# Patient Record
Sex: Female | Born: 1955 | Race: Black or African American | Hispanic: No | Marital: Single | Smoking: Never smoker
Health system: Southern US, Community
[De-identification: ages and names within clinical notes are randomized; demographics above are authoritative.]

## PROBLEM LIST (undated history)

## (undated) DIAGNOSIS — K219 Gastro-esophageal reflux disease without esophagitis: Secondary | ICD-10-CM

## (undated) DIAGNOSIS — M542 Cervicalgia: Secondary | ICD-10-CM

## (undated) DIAGNOSIS — I10 Essential (primary) hypertension: Secondary | ICD-10-CM

## (undated) HISTORY — PX: UPPER GI ENDOSCOPY: SHX6162

## (undated) HISTORY — PX: TUBAL LIGATION: SHX77

## (undated) HISTORY — PX: COLONOSCOPY: SHX174

## (undated) HISTORY — DX: Cervicalgia: M54.2

---

## 1997-11-10 ENCOUNTER — Other Ambulatory Visit: Admission: RE | Admit: 1997-11-10 | Discharge: 1997-11-10 | Payer: Self-pay | Admitting: Obstetrics & Gynecology

## 1998-06-02 ENCOUNTER — Inpatient Hospital Stay (HOSPITAL_COMMUNITY): Admission: AD | Admit: 1998-06-02 | Discharge: 1998-06-05 | Payer: Self-pay

## 2005-05-14 ENCOUNTER — Emergency Department (HOSPITAL_COMMUNITY): Admission: EM | Admit: 2005-05-14 | Discharge: 2005-05-14 | Payer: Self-pay | Admitting: Emergency Medicine

## 2005-08-09 ENCOUNTER — Emergency Department (HOSPITAL_COMMUNITY): Admission: EM | Admit: 2005-08-09 | Discharge: 2005-08-09 | Payer: Self-pay | Admitting: Family Medicine

## 2005-10-09 ENCOUNTER — Emergency Department (HOSPITAL_COMMUNITY): Admission: EM | Admit: 2005-10-09 | Discharge: 2005-10-09 | Payer: Self-pay | Admitting: Family Medicine

## 2006-05-22 ENCOUNTER — Emergency Department (HOSPITAL_COMMUNITY): Admission: EM | Admit: 2006-05-22 | Discharge: 2006-05-22 | Payer: Self-pay | Admitting: Family Medicine

## 2007-02-10 ENCOUNTER — Ambulatory Visit (HOSPITAL_COMMUNITY): Admission: RE | Admit: 2007-02-10 | Discharge: 2007-02-10 | Payer: Self-pay | Admitting: Obstetrics and Gynecology

## 2008-03-29 ENCOUNTER — Ambulatory Visit (HOSPITAL_COMMUNITY): Admission: RE | Admit: 2008-03-29 | Discharge: 2008-03-29 | Payer: Self-pay | Admitting: Obstetrics and Gynecology

## 2008-07-23 ENCOUNTER — Emergency Department (HOSPITAL_COMMUNITY): Admission: EM | Admit: 2008-07-23 | Discharge: 2008-07-23 | Payer: Self-pay | Admitting: Family Medicine

## 2009-05-19 ENCOUNTER — Emergency Department (HOSPITAL_COMMUNITY): Admission: EM | Admit: 2009-05-19 | Discharge: 2009-05-19 | Payer: Self-pay | Admitting: Emergency Medicine

## 2009-11-02 ENCOUNTER — Emergency Department (HOSPITAL_COMMUNITY): Admission: EM | Admit: 2009-11-02 | Discharge: 2009-11-02 | Payer: Self-pay | Admitting: Family Medicine

## 2010-07-16 ENCOUNTER — Encounter: Payer: Self-pay | Admitting: Obstetrics and Gynecology

## 2010-11-23 ENCOUNTER — Inpatient Hospital Stay (INDEPENDENT_AMBULATORY_CARE_PROVIDER_SITE_OTHER)
Admission: RE | Admit: 2010-11-23 | Discharge: 2010-11-23 | Disposition: A | Discharge status: Home / Self Care | Payer: Commercial Managed Care - PPO | Source: Ambulatory Visit | Attending: Family Medicine | Admitting: Family Medicine

## 2010-11-23 DIAGNOSIS — M542 Cervicalgia: Secondary | ICD-10-CM

## 2012-08-27 ENCOUNTER — Other Ambulatory Visit: Payer: Self-pay | Admitting: Orthopedic Surgery

## 2012-09-24 ENCOUNTER — Encounter (HOSPITAL_BASED_OUTPATIENT_CLINIC_OR_DEPARTMENT_OTHER)
Admission: RE | Admit: 2012-09-24 | Discharge: 2012-09-24 | Disposition: A | Discharge status: Home / Self Care | Payer: Commercial Managed Care - PPO | Source: Ambulatory Visit | Attending: Orthopedic Surgery | Admitting: Orthopedic Surgery

## 2012-09-24 ENCOUNTER — Encounter (HOSPITAL_BASED_OUTPATIENT_CLINIC_OR_DEPARTMENT_OTHER): Payer: Self-pay | Admitting: *Deleted

## 2012-09-24 LAB — BASIC METABOLIC PANEL
BUN: 8 mg/dL (ref 6–23)
Calcium: 9 mg/dL (ref 8.4–10.5)
Chloride: 104 mEq/L (ref 96–112)
Creatinine, Ser: 0.84 mg/dL (ref 0.50–1.10)
GFR calc Af Amer: 88 mL/min — ABNORMAL LOW (ref >90)

## 2012-09-24 NOTE — Progress Notes (Signed)
To come in for bet-ekg-had ekg over yr ago [address]

## 2012-09-29 ENCOUNTER — Ambulatory Visit (HOSPITAL_BASED_OUTPATIENT_CLINIC_OR_DEPARTMENT_OTHER)
Admission: RE | Admit: 2012-09-29 | Discharge: 2012-09-29 | Disposition: A | Discharge status: Home / Self Care | Payer: Commercial Managed Care - PPO | Source: Ambulatory Visit | Attending: Orthopedic Surgery | Admitting: Orthopedic Surgery

## 2012-09-29 ENCOUNTER — Ambulatory Visit (HOSPITAL_BASED_OUTPATIENT_CLINIC_OR_DEPARTMENT_OTHER): Payer: Commercial Managed Care - PPO | Admitting: Anesthesiology

## 2012-09-29 ENCOUNTER — Encounter (HOSPITAL_BASED_OUTPATIENT_CLINIC_OR_DEPARTMENT_OTHER): Payer: Self-pay | Admitting: Anesthesiology

## 2012-09-29 ENCOUNTER — Encounter (HOSPITAL_BASED_OUTPATIENT_CLINIC_OR_DEPARTMENT_OTHER)
Admission: RE | Disposition: A | Discharge status: Home / Self Care | Payer: Self-pay | Source: Ambulatory Visit | Attending: Orthopedic Surgery

## 2012-09-29 ENCOUNTER — Encounter (HOSPITAL_BASED_OUTPATIENT_CLINIC_OR_DEPARTMENT_OTHER): Payer: Self-pay

## 2012-09-29 DIAGNOSIS — D649 Anemia, unspecified: Secondary | ICD-10-CM | POA: Insufficient documentation

## 2012-09-29 DIAGNOSIS — K219 Gastro-esophageal reflux disease without esophagitis: Secondary | ICD-10-CM | POA: Insufficient documentation

## 2012-09-29 DIAGNOSIS — I10 Essential (primary) hypertension: Secondary | ICD-10-CM | POA: Insufficient documentation

## 2012-09-29 DIAGNOSIS — Z79899 Other long term (current) drug therapy: Secondary | ICD-10-CM | POA: Insufficient documentation

## 2012-09-29 DIAGNOSIS — M6530 Trigger finger, unspecified finger: Secondary | ICD-10-CM | POA: Insufficient documentation

## 2012-09-29 HISTORY — DX: Essential (primary) hypertension: I10

## 2012-09-29 HISTORY — DX: Gastro-esophageal reflux disease without esophagitis: K21.9

## 2012-09-29 HISTORY — PX: TRIGGER FINGER RELEASE: SHX641

## 2012-09-29 SURGERY — RELEASE, A1 PULLEY, FOR TRIGGER FINGER
Anesthesia: General | Site: Finger | Laterality: Right | Wound class: Clean

## 2012-09-29 MED ORDER — FENTANYL CITRATE 0.05 MG/ML IJ SOLN
INTRAMUSCULAR | Status: DC | PRN
  Administered 2012-09-29: 100 ug via INTRAVENOUS

## 2012-09-29 MED ORDER — CEFAZOLIN SODIUM-DEXTROSE 2-3 GM-% IV SOLR: 2.0000 g | INTRAVENOUS | Status: DC

## 2012-09-29 MED ORDER — MIDAZOLAM HCL 5 MG/5ML IJ SOLN
INTRAMUSCULAR | Status: DC | PRN
  Administered 2012-09-29: 2 mg via INTRAVENOUS

## 2012-09-29 MED ORDER — PROPOFOL 10 MG/ML IV BOLUS
INTRAVENOUS | Status: DC | PRN
  Administered 2012-09-29: 150 mg via INTRAVENOUS

## 2012-09-29 MED ORDER — OXYCODONE HCL 5 MG PO TABS: 5.0000 mg | ORAL_TABLET | Freq: Once | ORAL | Status: DC | PRN

## 2012-09-29 MED ORDER — LIDOCAINE HCL (CARDIAC) 20 MG/ML IV SOLN
INTRAVENOUS | Status: DC | PRN
  Administered 2012-09-29: 60 mg via INTRAVENOUS

## 2012-09-29 MED ORDER — CHLORHEXIDINE GLUCONATE 4 % EX LIQD: 60.0000 mL | Freq: Once | CUTANEOUS | Status: DC

## 2012-09-29 MED ORDER — ONDANSETRON HCL 4 MG/2ML IJ SOLN
INTRAMUSCULAR | Status: DC | PRN
  Administered 2012-09-29: 4 mg via INTRAVENOUS

## 2012-09-29 MED ORDER — HYDROMORPHONE HCL PF 1 MG/ML IJ SOLN: 0.2500 mg | INTRAMUSCULAR | Status: DC | PRN

## 2012-09-29 MED ORDER — DEXAMETHASONE SODIUM PHOSPHATE 10 MG/ML IJ SOLN
INTRAMUSCULAR | Status: DC | PRN
  Administered 2012-09-29: 10 mg via INTRAVENOUS

## 2012-09-29 MED ORDER — FENTANYL CITRATE 0.05 MG/ML IJ SOLN: 50.0000 ug | INTRAMUSCULAR | Status: DC | PRN

## 2012-09-29 MED ORDER — CEFAZOLIN SODIUM-DEXTROSE 2-3 GM-% IV SOLR
2.0000 g | INTRAVENOUS | Status: AC
  Administered 2012-09-29: 2 g via INTRAVENOUS

## 2012-09-29 MED ORDER — LACTATED RINGERS IV SOLN
INTRAVENOUS | Status: DC
  Administered 2012-09-29: 13:00:00 via INTRAVENOUS

## 2012-09-29 MED ORDER — BUPIVACAINE HCL (PF) 0.25 % IJ SOLN
INTRAMUSCULAR | Status: DC | PRN
  Administered 2012-09-29: 5 mL

## 2012-09-29 MED ORDER — HYDROCODONE-ACETAMINOPHEN 5-325 MG PO TABS: 1.0000 | ORAL_TABLET | Freq: Four times a day (QID) | ORAL | Status: DC | PRN

## 2012-09-29 MED ORDER — MIDAZOLAM HCL 2 MG/2ML IJ SOLN: 1.0000 mg | INTRAMUSCULAR | Status: DC | PRN

## 2012-09-29 MED ORDER — OXYCODONE HCL 5 MG/5ML PO SOLN: 5.0000 mg | Freq: Once | ORAL | Status: DC | PRN

## 2012-09-29 SURGICAL SUPPLY — 34 items
BANDAGE COBAN STERILE 2 (GAUZE/BANDAGES/DRESSINGS) ×2 IMPLANT
BLADE SURG 15 STRL LF DISP TIS (BLADE) ×1 IMPLANT
BLADE SURG 15 STRL SS (BLADE) ×1
BNDG ESMARK 4X9 LF (GAUZE/BANDAGES/DRESSINGS) ×2 IMPLANT
CHLORAPREP W/TINT 26ML (MISCELLANEOUS) ×2 IMPLANT
CLOTH BEACON ORANGE TIMEOUT ST (SAFETY) ×2 IMPLANT
CORDS BIPOLAR (ELECTRODE) IMPLANT
COVER MAYO STAND STRL (DRAPES) ×2 IMPLANT
COVER TABLE BACK 60X90 (DRAPES) ×2 IMPLANT
CUFF TOURNIQUET SINGLE 18IN (TOURNIQUET CUFF) ×2 IMPLANT
DECANTER SPIKE VIAL GLASS SM (MISCELLANEOUS) IMPLANT
DRAPE EXTREMITY T 121X128X90 (DRAPE) ×2 IMPLANT
DRAPE SURG 17X23 STRL (DRAPES) ×2 IMPLANT
GAUZE XEROFORM 1X8 LF (GAUZE/BANDAGES/DRESSINGS) ×2 IMPLANT
GLOVE BIO SURGEON STRL SZ 6.5 (GLOVE) ×2 IMPLANT
GLOVE BIOGEL PI IND STRL 8.5 (GLOVE) ×1 IMPLANT
GLOVE BIOGEL PI INDICATOR 8.5 (GLOVE) ×1
GLOVE SKINSENSE NS SZ7.0 (GLOVE) ×1
GLOVE SKINSENSE STRL SZ7.0 (GLOVE) ×1 IMPLANT
GLOVE SURG ORTHO 8.0 STRL STRW (GLOVE) ×2 IMPLANT
GOWN BRE IMP PREV XXLGXLNG (GOWN DISPOSABLE) ×2 IMPLANT
GOWN PREVENTION PLUS XLARGE (GOWN DISPOSABLE) ×2 IMPLANT
NEEDLE 27GAX1X1/2 (NEEDLE) ×2 IMPLANT
NS IRRIG 1000ML POUR BTL (IV SOLUTION) ×2 IMPLANT
PACK BASIN DAY SURGERY FS (CUSTOM PROCEDURE TRAY) ×2 IMPLANT
PADDING CAST ABS 4INX4YD NS (CAST SUPPLIES) ×1
PADDING CAST ABS COTTON 4X4 ST (CAST SUPPLIES) ×1 IMPLANT
SPONGE GAUZE 4X4 12PLY (GAUZE/BANDAGES/DRESSINGS) ×2 IMPLANT
STOCKINETTE 4X48 STRL (DRAPES) ×2 IMPLANT
SUT VICRYL RAPIDE 4/0 PS 2 (SUTURE) ×2 IMPLANT
SYR BULB 3OZ (MISCELLANEOUS) ×2 IMPLANT
SYR CONTROL 10ML LL (SYRINGE) ×2 IMPLANT
TOWEL OR 17X24 6PK STRL BLUE (TOWEL DISPOSABLE) ×2 IMPLANT
UNDERPAD 30X30 INCONTINENT (UNDERPADS AND DIAPERS) ×2 IMPLANT

## 2012-09-29 NOTE — Anesthesia Procedure Notes (Signed)
Procedure Name: LMA Insertion Date/Time: 09/29/2012 1:46 PM Performed by: Meyer Russel Pre-anesthesia Checklist: Patient identified, Emergency Drugs available, Suction available and Patient being monitored Patient Re-evaluated:Patient Re-evaluated prior to inductionOxygen Delivery Method: Circle System Utilized Preoxygenation: Pre-oxygenation with 100% oxygen Intubation Type: IV induction Ventilation: Mask ventilation without difficulty LMA: LMA inserted LMA Size: 4.0 Number of attempts: 1 Airway Equipment and Method: bite block Placement Confirmation: positive ETCO2 and breath sounds checked- equal and bilateral Tube secured with: Tape Dental Injury: Teeth and Oropharynx as per pre-operative assessment

## 2012-09-29 NOTE — Anesthesia Postprocedure Evaluation (Signed)
  Anesthesia Post-op Note  Patient: Claire Torres  Procedure(s) Performed: Procedure(s): RELEASE A-1 PULLEY RIGHT MIDDLE FINGER  (Right)  Patient Location: PACU  Anesthesia Type:General  Level of Consciousness: awake and alert   Airway and Oxygen Therapy: Patient Spontanous Breathing  Post-op Pain: none  Post-op Assessment: Post-op Vital signs reviewed, Patient's Cardiovascular Status Stable, Respiratory Function Stable, Patent Airway and No signs of Nausea or vomiting  Post-op Vital Signs: Reviewed and stable  Complications: No apparent anesthesia complications

## 2012-09-29 NOTE — Op Note (Signed)
Dictation Number [id number]

## 2012-09-29 NOTE — Transfer of Care (Signed)
Immediate Anesthesia Transfer of Care Note  Patient: Claire Torres  Procedure(s) Performed: Procedure(s): RELEASE A-1 PULLEY RIGHT MIDDLE FINGER  (Right)  Patient Location: PACU  Anesthesia Type:General  Level of Consciousness: awake and sedated  Airway & Oxygen Therapy: Patient Spontanous Breathing and Patient connected to face mask oxygen  Post-op Assessment: Report given to PACU RN, Post -op Vital signs reviewed and stable and Patient moving all extremities  Post vital signs: Reviewed and stable  Complications: No apparent anesthesia complications

## 2012-09-29 NOTE — Brief Op Note (Signed)
09/29/2012  2:07 PM  PATIENT:  Claire Torres  57 y.o. female  PRE-OPERATIVE DIAGNOSIS:  STENOSING TENOSYNOVITIS RIGHT MIDDLE FINGER   POST-OPERATIVE DIAGNOSIS:  STENOSING TENOSYNOVITIS RIGHT MIDDLE FINGER   PROCEDURE:  Procedure(s): RELEASE A-1 PULLEY RIGHT MIDDLE FINGER  (Right)  SURGEON:  Surgeon(s) and Role:    * Nicki Reaper, MD - Primary  PHYSICIAN ASSISTANT:   ASSISTANTS: none   ANESTHESIA:   local and general  EBL:  Total I/O In: 800 [I.V.:800] Out: -   BLOOD ADMINISTERED:none  DRAINS: none   LOCAL MEDICATIONS USED:  MARCAINE     SPECIMEN:  No Specimen  DISPOSITION OF SPECIMEN:  N/A  COUNTS:  YES  TOURNIQUET:   Total Tourniquet Time Documented: Forearm (Right) - 9 minutes Total: Forearm (Right) - 9 minutes   DICTATION: .Other Dictation: Dictation Number [id number]  PLAN OF CARE: Discharge to home after PACU  PATIENT DISPOSITION:  PACU - hemodynamically stable.

## 2012-09-29 NOTE — Anesthesia Preprocedure Evaluation (Signed)
Anesthesia Evaluation  Patient identified by MRN, date of birth, ID band Patient awake    Reviewed: Allergy & Precautions, H&P , NPO status , Patient's Chart, lab work & pertinent test results  Airway Mallampati: II TM Distance: >3 FB Neck ROM: Full    Dental no notable dental hx. (+) Teeth Intact and Dental Advisory Given   Pulmonary neg pulmonary ROS,  breath sounds clear to auscultation  Pulmonary exam normal       Cardiovascular hypertension, On Medications Rhythm:Regular Rate:Normal     Neuro/Psych negative neurological ROS  negative psych ROS   GI/Hepatic Neg liver ROS, GERD-  Controlled,  Endo/Other  negative endocrine ROS  Renal/GU negative Renal ROS  negative genitourinary   Musculoskeletal   Abdominal   Peds  Hematology negative hematology ROS (+)   Anesthesia Other Findings   Reproductive/Obstetrics negative OB ROS                           Anesthesia Physical Anesthesia Plan  ASA: II  Anesthesia Plan: General   Post-op Pain Management:    Induction: Intravenous  Airway Management Planned: LMA  Additional Equipment:   Intra-op Plan:   Post-operative Plan: Extubation in OR  Informed Consent: I have reviewed the patients History and Physical, chart, labs and discussed the procedure including the risks, benefits and alternatives for the proposed anesthesia with the patient or authorized representative who has indicated his/her understanding and acceptance.   Dental advisory given  Plan Discussed with: CRNA  Anesthesia Plan Comments:         Anesthesia Quick Evaluation

## 2012-09-29 NOTE — H&P (Signed)
  Claire Torres is a 57 year-old left-hand dominant female who comes in complaining of catching of right middle finger. This has been going on for approximately three months. She has no history of injury. There is a family history of diabetes, no history of thyroid problems, arthritis or gout.  She has been tested for diabetes, this has been negative.  She complains of an intermittent, moderate pain when it catches. This will occasionally awaken her from sleep.  She is not complaining of any numbness or tingling.  This has done well, but has been injected on two occasions without relief of symptoms.    ALLERGIES:    None.  MEDICATIONS:    None.  SURGICAL HISTORY:   Tubal ligation.     FAMILY MEDICAL HISTORY:    Positive for diabetes, heart disease and high blood pressure.    SOCIAL HISTORY:    She does not smoke or drink. She is single. She is a Chief Financial Officer.    REVIEW OF SYSTEMS:    Positive for high blood pressure, anemia, otherwise negative 14 points.   Claire Torres is an 57 y.o. female.   Chief Complaint: sts rmf HPI: see above  Past Medical History  Diagnosis Date  . Hypertension   . GERD (gastroesophageal reflux disease)     hx no meds    Past Surgical History  Procedure Laterality Date  . Tubal ligation    . Colonoscopy    . Upper gi endoscopy      History reviewed. No pertinent family history. Social History:  reports that she has never smoked. She does not have any smokeless tobacco history on file. She reports that she does not drink alcohol or use illicit drugs.  Allergies: No Known Allergies  Medications Prior to Admission  Medication Sig Dispense Refill  . losartan (COZAAR) 50 MG tablet Take 50 mg by mouth every evening.      . Vitamin D, Ergocalciferol, (DRISDOL) 50000 UNITS CAPS Take 50,000 Units by mouth every 7 (seven) days.        No results found for this or any previous visit (from the past 48 hour(s)).  No results found.   Pertinent items are  noted in HPI.  Blood pressure 148/91, pulse 65, temperature 97.4 F (36.3 C), temperature source Oral, resp. rate 20, height 5\' 3"  (1.6 m), weight 71.578 kg (157 lb 12.8 oz), SpO2 99.00%.  General appearance: alert, cooperative and appears stated age Head: Normocephalic, without obvious abnormality Neck: no JVD Resp: clear to auscultation bilaterally Cardio: regular rate and rhythm, S1, S2 normal, no murmur, click, rub or gallop GI: soft, non-tender; bowel sounds normal; no masses,  no organomegaly Extremities: extremities normal, atraumatic, no cyanosis or edema Pulses: 2+ and symmetric Skin: Skin color, texture, turgor normal. No rashes or lesions Neurologic: Grossly normal Incision/Wound: na  Assessment/Plan  DIAGNOSIS:     Stenosing tenosynovitis right middle finger.  Plan:  The pre, peri and postoperative course were discussed along with the risks and complications.  The patient is aware there is no guarantee with the surgery, possibility of infection, recurrence, injury to arteries, nerves, tendons, incomplete relief of symptoms and dystrophy.  She is scheduled for release A-1 pulley right middle finger as an outpatient under regional anesthesia.  [name] R 09/29/2012, 1:35 PM

## 2012-09-30 ENCOUNTER — Encounter (HOSPITAL_BASED_OUTPATIENT_CLINIC_OR_DEPARTMENT_OTHER): Payer: Self-pay | Admitting: Orthopedic Surgery

## 2012-09-30 NOTE — Op Note (Signed)
NAMECLARABELL, MATSUOKA                 ACCOUNT NO.:  [id number]  MEDICAL RECORD NO.:  [id number]  LOCATION:                                 FACILITY:  PHYSICIAN:  Cindee Salt, M.D.            DATE OF BIRTH:  DATE OF PROCEDURE:  09/29/2012 DATE OF DISCHARGE:                              OPERATIVE REPORT   PREOPERATIVE DIAGNOSIS:  Stenosing tenosynovitis, right middle finger.  POSTOPERATIVE DIAGNOSIS:  Stenosing tenosynovitis, right middle finger.  OPERATION:  Release A1 pulley, right middle finger.  SURGEON:  Cindee Salt, MD  ANESTHESIA:  General with local infiltration.  ANESTHESIOLOGIST:  Zenon Mayo, MD  HISTORY:  The patient is a 57 year old female with triggering of her right middle finger.  This has not responded to injections.  She has elected to undergo surgical release.  Pre, peri, and postoperative course have been discussed along with risks and complications.  She is aware that there is no guarantee with surgery; possibility of infection, recurrence of injury to arteries, nerves, tendons, incomplete relief of symptoms, dystrophy.  In the preoperative area, the patient is seen, the extremity marked by both patient and surgeon.  Antibiotic given.  PROCEDURE:  The patient was brought to the operating room where a general anesthetic was carried out without difficulty.  She was prepped using ChloraPrep, supine position, right arm free.  A 3-minute dry time was allowed.  Time-out taken, confirming patient and procedure.  An oblique incision was made over the A1 pulley of the right middle finger and carried down through subcutaneous tissue.  Bleeders were electrocauterized with bipolar.  The dissection carried down to the A1 pulley.  This was released on its radial aspect after placement of retractors protecting neurovascular bundles.  A small incision was made centrally in A2.  Finger placed through a full range motion.  No further triggering was noted.  Partial  tenosynovectomy was performed proximally relieving any adherence between superficialis profundus tenosynovium. Wound was copiously irrigated with saline.  The skin was closed with interrupted 4-0 Vicryl Rapide sutures.  Local infiltration with 0.25% Marcaine without epinephrine was given, 5 mL was used.  Sterile compressive dressing was applied with the fingers free.  On deflation of the tourniquet, all fingers immediately pinked.  She was taken to the recovery room for observation in satisfactory condition.  She will be discharged home to return to the [facility] in 1 week on Norco.          ______________________________ Cindee Salt, M.D.     GK/MEDQ  D:  09/29/2012  T:  09/30/2012  Job:  [id number]

## 2013-05-02 ENCOUNTER — Emergency Department (HOSPITAL_COMMUNITY)
Admission: EM | Admit: 2013-05-02 | Discharge: 2013-05-02 | Disposition: A | Discharge status: Home / Self Care | Payer: Commercial Managed Care - PPO | Attending: Emergency Medicine | Admitting: Emergency Medicine

## 2013-05-02 ENCOUNTER — Encounter (HOSPITAL_COMMUNITY): Payer: Self-pay | Admitting: Emergency Medicine

## 2013-05-02 DIAGNOSIS — I10 Essential (primary) hypertension: Secondary | ICD-10-CM | POA: Insufficient documentation

## 2013-05-02 DIAGNOSIS — J069 Acute upper respiratory infection, unspecified: Secondary | ICD-10-CM | POA: Insufficient documentation

## 2013-05-02 DIAGNOSIS — G8929 Other chronic pain: Secondary | ICD-10-CM | POA: Insufficient documentation

## 2013-05-02 DIAGNOSIS — S139XXA Sprain of joints and ligaments of unspecified parts of neck, initial encounter: Secondary | ICD-10-CM | POA: Insufficient documentation

## 2013-05-02 DIAGNOSIS — Y929 Unspecified place or not applicable: Secondary | ICD-10-CM | POA: Insufficient documentation

## 2013-05-02 DIAGNOSIS — S161XXA Strain of muscle, fascia and tendon at neck level, initial encounter: Secondary | ICD-10-CM

## 2013-05-02 DIAGNOSIS — X58XXXA Exposure to other specified factors, initial encounter: Secondary | ICD-10-CM | POA: Insufficient documentation

## 2013-05-02 DIAGNOSIS — Z8719 Personal history of other diseases of the digestive system: Secondary | ICD-10-CM | POA: Insufficient documentation

## 2013-05-02 DIAGNOSIS — Y9389 Activity, other specified: Secondary | ICD-10-CM | POA: Insufficient documentation

## 2013-05-02 MED ORDER — PREDNISONE 50 MG PO TABS: 50.0000 mg | ORAL_TABLET | Freq: Every day | ORAL | Status: DC

## 2013-05-02 MED ORDER — ACETAMINOPHEN-CODEINE 120-12 MG/5ML PO SOLN: 10.0000 mL | ORAL | Status: DC | PRN

## 2013-05-02 MED ORDER — GUAIFENESIN ER 1200 MG PO TB12: 1.0000 | ORAL_TABLET | Freq: Two times a day (BID) | ORAL | Status: DC

## 2013-05-02 NOTE — ED Notes (Addendum)
Patient states that she has cold like symptoms, nasal congestion, sore throat, ears stopped up. Also complains of neck pain due to work requirements. Patient states she is constantly looking upward. Pain is a 9/10 is aggravated when lifting and pulling.

## 2013-05-02 NOTE — ED Provider Notes (Signed)
CSN: [id number]     Arrival date & time 05/02/13  1333 History  This chart was scribed for non-physician practitioner Ebbie Ridge, PA, working with Lyanne Co, MD by Ronal Fear, ED scribe. This patient was seen in room WTR8/WTR8 and the patient's care was started at 1:57 PM.    Chief Complaint  Patient presents with  . Neck Pain  . Nasal Congestion   The history is provided by the patient. No language interpreter was used.   HPI Comments: Claire Torres is a 57 y.o. female who presents to the Emergency Department complaining of waxing and waning chronic neck pain with cold symptoms that started last night, sore throat and coughing.  She has coworkers that are also exhibiting similar symptoms. Pt has taken tylenol and coricidin with mild relief. She states that she has been congested since last night. Pt denies vomiting. She does not appear to be in any acute distress with no other complaints.   Past Medical History  Diagnosis Date  . Hypertension   . GERD (gastroesophageal reflux disease)     hx no meds   Past Surgical History  Procedure Laterality Date  . Tubal ligation    . Colonoscopy    . Upper gi endoscopy    . Trigger finger release Right 09/29/2012    Procedure: RELEASE A-1 PULLEY RIGHT MIDDLE FINGER ;  Surgeon: Nicki Reaper, MD;  Location: [facility];  Service: Orthopedics;  Laterality: Right;   No family history on file. History  Substance Use Topics  . Smoking status: Never Smoker   . Smokeless tobacco: Not on file  . Alcohol Use: No   OB History   Grav Para Term Preterm Abortions TAB SAB Ect Mult Living                 Review of Systems  Constitutional: Negative for chills.  HENT: Positive for congestion and sore throat.   Respiratory: Positive for cough.   Gastrointestinal: Negative for nausea, vomiting and abdominal pain.  Musculoskeletal: Positive for neck pain.  All other systems reviewed and are negative.    Allergies  Review of  patient's allergies indicates no known allergies.  Home Medications   Current Outpatient Rx  Name  Route  Sig  Dispense  Refill  . HYDROcodone-acetaminophen (NORCO) 5-325 MG per tablet   Oral   Take 1 tablet by mouth every 6 (six) hours as needed for pain.   30 tablet   0   . losartan (COZAAR) 50 MG tablet   Oral   Take 50 mg by mouth every evening.         . Vitamin D, Ergocalciferol, (DRISDOL) 50000 UNITS CAPS   Oral   Take 50,000 Units by mouth every 7 (seven) days.          BP 123/82  Pulse 106  Temp(Src) 98.3 F (36.8 C)  Resp 20  SpO2 98% Physical Exam  Nursing note and vitals reviewed. Constitutional: She is oriented to person, place, and time. She appears well-developed and well-nourished.  HENT:  Head: Normocephalic and atraumatic.  Nose: Nose normal.  Mouth/Throat: Oropharynx is clear and moist.  Eyes: Pupils are equal, round, and reactive to light.  Neck: Normal range of motion. Neck supple.  Cardiovascular: Normal rate, regular rhythm and normal heart sounds.   Pulmonary/Chest: Effort normal and breath sounds normal. No respiratory distress. She has no wheezes.  Neurological: She is alert and oriented to person, place, and time.  Skin: Skin is warm and dry.  Psychiatric: She has a normal mood and affect.    ED Course  Procedures (including critical care time) DIAGNOSTIC STUDIES: Oxygen Saturation is 98% on RA, normal by my interpretation.    COORDINATION OF CARE:    2:00 PM- Pt advised of plan for treatment including medication for pain and congestion and pt agrees.    Carlyle Dolly, PA-C 05/03/13 1404

## 2013-05-03 NOTE — ED Provider Notes (Signed)
Medical screening examination/treatment/procedure(s) were performed by non-physician practitioner and as supervising physician I was immediately available for consultation/collaboration.  EKG Interpretation   None         Syble Picco M Bemnet Trovato, MD 05/03/13 1405 

## 2013-05-04 ENCOUNTER — Emergency Department (HOSPITAL_COMMUNITY): Payer: Commercial Managed Care - PPO

## 2013-05-04 ENCOUNTER — Encounter (HOSPITAL_COMMUNITY): Payer: Self-pay | Admitting: Emergency Medicine

## 2013-05-04 ENCOUNTER — Emergency Department (HOSPITAL_COMMUNITY)
Admission: EM | Admit: 2013-05-04 | Discharge: 2013-05-04 | Disposition: A | Discharge status: Home / Self Care | Payer: Commercial Managed Care - PPO | Attending: Emergency Medicine | Admitting: Emergency Medicine

## 2013-05-04 DIAGNOSIS — B9789 Other viral agents as the cause of diseases classified elsewhere: Secondary | ICD-10-CM | POA: Insufficient documentation

## 2013-05-04 DIAGNOSIS — M542 Cervicalgia: Secondary | ICD-10-CM | POA: Insufficient documentation

## 2013-05-04 DIAGNOSIS — Z8719 Personal history of other diseases of the digestive system: Secondary | ICD-10-CM | POA: Insufficient documentation

## 2013-05-04 DIAGNOSIS — R5381 Other malaise: Secondary | ICD-10-CM | POA: Insufficient documentation

## 2013-05-04 DIAGNOSIS — M25569 Pain in unspecified knee: Secondary | ICD-10-CM | POA: Insufficient documentation

## 2013-05-04 DIAGNOSIS — B349 Viral infection, unspecified: Secondary | ICD-10-CM

## 2013-05-04 DIAGNOSIS — M549 Dorsalgia, unspecified: Secondary | ICD-10-CM | POA: Insufficient documentation

## 2013-05-04 DIAGNOSIS — J029 Acute pharyngitis, unspecified: Secondary | ICD-10-CM | POA: Insufficient documentation

## 2013-05-04 DIAGNOSIS — M25559 Pain in unspecified hip: Secondary | ICD-10-CM | POA: Insufficient documentation

## 2013-05-04 DIAGNOSIS — R000 Tachycardia, unspecified: Secondary | ICD-10-CM | POA: Insufficient documentation

## 2013-05-04 DIAGNOSIS — I10 Essential (primary) hypertension: Secondary | ICD-10-CM | POA: Insufficient documentation

## 2013-05-04 DIAGNOSIS — IMO0002 Reserved for concepts with insufficient information to code with codable children: Secondary | ICD-10-CM | POA: Insufficient documentation

## 2013-05-04 DIAGNOSIS — Z79899 Other long term (current) drug therapy: Secondary | ICD-10-CM | POA: Insufficient documentation

## 2013-05-04 LAB — BASIC METABOLIC PANEL
CO2: 26 mEq/L (ref 19–32)
Calcium: 9.1 mg/dL (ref 8.4–10.5)
Creatinine, Ser: 0.81 mg/dL (ref 0.50–1.10)
GFR calc non Af Amer: 79 mL/min — ABNORMAL LOW (ref >90)
Sodium: 135 mEq/L (ref 135–145)

## 2013-05-04 LAB — CBC WITH DIFFERENTIAL/PLATELET
Basophils Absolute: 0 10*3/uL (ref 0.0–0.1)
Basophils Relative: 0 % (ref 0–1)
Eosinophils Absolute: 0 10*3/uL (ref 0.0–0.7)
Eosinophils Relative: 0 % (ref 0–5)
HCT: 36.8 % (ref 36.0–46.0)
Lymphocytes Relative: 5 % — ABNORMAL LOW (ref 12–46)
MCH: 30.4 pg (ref 26.0–34.0)
MCHC: 33.4 g/dL (ref 30.0–36.0)
MCV: 91.1 fL (ref 78.0–100.0)
Monocytes Absolute: 0.9 10*3/uL (ref 0.1–1.0)
Platelets: 236 10*3/uL (ref 150–400)
RDW: 12.8 % (ref 11.5–15.5)
WBC: 13.6 10*3/uL — ABNORMAL HIGH (ref 4.0–10.5)

## 2013-05-04 MED ORDER — SODIUM CHLORIDE 0.9 % IV BOLUS (SEPSIS)
1000.0000 mL | Freq: Once | INTRAVENOUS | Status: AC
  Administered 2013-05-04: 1000 mL via INTRAVENOUS

## 2013-05-04 MED ORDER — KETOROLAC TROMETHAMINE 30 MG/ML IJ SOLN
30.0000 mg | Freq: Once | INTRAMUSCULAR | Status: AC
  Administered 2013-05-04: 30 mg via INTRAVENOUS
  Filled 2013-05-04: qty 1

## 2013-05-04 NOTE — ED Notes (Signed)
Patient is alert and oriented x3.   She is complaining of neck pain, leg pain and congestion.   She was seen at [facility] on Saturday and has not gotten any better.

## 2013-05-04 NOTE — ED Notes (Signed)
Patient back from CXR Patient appears in NAD Side rails up, call bell in reach

## 2013-05-04 NOTE — ED Provider Notes (Signed)
Medical screening examination/treatment/procedure(s) were performed by non-physician practitioner and as supervising physician I was immediately available for consultation/collaboration.    Sunnie Nielsen, MD 05/04/13 [phone]

## 2013-05-04 NOTE — ED Provider Notes (Signed)
CSN: [id number]     Arrival date & time 05/04/13  0344 History   First MD Initiated Contact with Patient 05/04/13 0431     Chief Complaint  Patient presents with  . Body aches    HPI  History provided by the patient, family and recent medical chart. Patient is a 57 year old female with history of hypertension and GERD who presents with continued symptoms of fever, chills, bodyaches, cough, congestion and sore throat. Symptoms began 3 days ago. She was seen in the emergency department 2 days ago with similar complaints and diagnosed with URI. She was given prescriptions for Mucinex and liquid Tylenol with codeine for cough and pain. Patient used one dose of the Tylenol and codeine last night around midnight and reports this helped for about one hour. She also took Mucinex yesterday during the day but has not had any significant improvement of her congestion or cough symptoms. She returns with complaints of continued body aches including her lower legs, thighs, back and neck. She also reports mild headache symptoms. She denies any aggravating or alleviating factors. Patient is a Production designer, theatre/television/film at [employer] and reports many of her employees have been sick with cough and cold symptoms. No other associated symptoms.  No N/V/D.    Past Medical History  Diagnosis Date  . Hypertension   . GERD (gastroesophageal reflux disease)     hx no meds   Past Surgical History  Procedure Laterality Date  . Tubal ligation    . Colonoscopy    . Upper gi endoscopy    . Trigger finger release Right 09/29/2012    Procedure: RELEASE A-1 PULLEY RIGHT MIDDLE FINGER ;  Surgeon: Nicki Reaper, MD;  Location: [facility];  Service: Orthopedics;  Laterality: Right;   History reviewed. No pertinent family history. History  Substance Use Topics  . Smoking status: Never Smoker   . Smokeless tobacco: Not on file  . Alcohol Use: No   OB History   Grav Para Term Preterm Abortions TAB SAB Ect Mult Living           Review of Systems  Constitutional: Positive for fever, chills and fatigue.  HENT: Positive for congestion and sore throat.   Respiratory: Positive for cough. Negative for shortness of breath.   Gastrointestinal: Negative for nausea, vomiting, abdominal pain, diarrhea and constipation.  Musculoskeletal: Positive for back pain, myalgias and neck pain.  Neurological: Positive for headaches.  All other systems reviewed and are negative.    Allergies  Review of patient's allergies indicates no known allergies.  Home Medications   Current Outpatient Rx  Name  Route  Sig  Dispense  Refill  . acetaminophen-codeine 120-12 MG/5ML solution   Oral   Take 10 mLs by mouth every 4 (four) hours as needed for moderate pain.   120 mL   0   . guaiFENesin (MUCINEX) 600 MG 12 hr tablet   Oral   Take 1,200 mg by mouth 2 (two) times daily as needed for cough.         . losartan (COZAAR) 50 MG tablet   Oral   Take 50 mg by mouth every evening.         Marland Kitchen losartan-hydrochlorothiazide (HYZAAR) 100-25 MG per tablet   Oral   Take 1 tablet by mouth daily.         . Menthol, Topical Analgesic, (BLUE-EMU MAXIMUM STRENGTH EX)   Apply externally   Apply 1 application topically 3 (three) times daily as needed (  back pain).         . Vitamin D, Ergocalciferol, (DRISDOL) 50000 UNITS CAPS   Oral   Take 50,000 Units by mouth every 7 (seven) days.         . predniSONE (DELTASONE) 50 MG tablet   Oral   Take 1 tablet (50 mg total) by mouth daily.   5 tablet   0    BP 121/74  Pulse 112  Temp(Src) 100.4 F (38 C) (Oral)  Resp 20  SpO2 96% Physical Exam  Nursing note and vitals reviewed. Constitutional: She is oriented to person, place, and time. She appears well-developed and well-nourished. No distress.  HENT:  Head: Normocephalic.  Right Ear: Tympanic membrane normal.  Left Ear: Tympanic membrane normal.  Mouth/Throat: Oropharynx is clear and moist.  Eyes: Conjunctivae and  EOM are normal.  Neck: Normal range of motion. Neck supple.  No meningeal signs  Cardiovascular: Regular rhythm.  Tachycardia present.   No murmur heard. Pulmonary/Chest: Effort normal and breath sounds normal. No respiratory distress. She has no wheezes. She has no rales.  Abdominal: Soft. There is no tenderness. There is no rebound and no guarding.  Musculoskeletal: Normal range of motion. She exhibits no edema.  Lymphadenopathy:    She has no cervical adenopathy.  Neurological: She is alert and oriented to person, place, and time.  Skin: Skin is warm and dry. No rash noted.  Psychiatric: She has a normal mood and affect. Her behavior is normal.    ED Course  Procedures   DIAGNOSTIC STUDIES: Oxygen Saturation is 96% on room air.    COORDINATION OF CARE:  Nursing notes reviewed. Vital signs reviewed. Initial pt interview and examination performed.   4:43 AM-patient seen and evaluated. Patient does not appear in any acute distress. She does not appear severely ill or toxic. No meningeal signs. Does have fever and slight tachycardia. Discussed work up plan with pt at bedside, which includes lab testing, strep test.  Pt agrees with plan.   Treatment plan initiated: Medications  sodium chloride 0.9 % bolus 1,000 mL (not administered)  ketorolac (TORADOL) 30 MG/ML injection 30 mg (not administered)   Results for orders placed during the hospital encounter of 05/04/13  RAPID STREP SCREEN      Result Value Range   Streptococcus, Group A Screen (Direct) NEGATIVE  NEGATIVE  CBC WITH DIFFERENTIAL      Result Value Range   WBC 13.6 (*) 4.0 - 10.5 K/uL   RBC 4.04  3.87 - 5.11 MIL/uL   Hemoglobin 12.3  12.0 - 15.0 g/dL   HCT 16.1  09.6 - 04.5 %   MCV 91.1  78.0 - 100.0 fL   MCH 30.4  26.0 - 34.0 pg   MCHC 33.4  30.0 - 36.0 g/dL   RDW 40.9  81.1 - 91.4 %   Platelets 236  150 - 400 K/uL   Neutrophils Relative % 88 (*) 43 - 77 %   Neutro Abs 12.0 (*) 1.7 - 7.7 K/uL   Lymphocytes  Relative 5 (*) 12 - 46 %   Lymphs Abs 0.7  0.7 - 4.0 K/uL   Monocytes Relative 7  3 - 12 %   Monocytes Absolute 0.9  0.1 - 1.0 K/uL   Eosinophils Relative 0  0 - 5 %   Eosinophils Absolute 0.0  0.0 - 0.7 K/uL   Basophils Relative 0  0 - 1 %   Basophils Absolute 0.0  0.0 - 0.1 K/uL  BASIC  METABOLIC PANEL      Result Value Range   Sodium 135  135 - 145 mEq/L   Potassium 3.6  3.5 - 5.1 mEq/L   Chloride 101  96 - 112 mEq/L   CO2 26  19 - 32 mEq/L   Glucose, Bld 137 (*) 70 - 99 mg/dL   BUN 13  6 - 23 mg/dL   Creatinine, Ser 1.61  0.50 - 1.10 mg/dL   Calcium 9.1  8.4 - 09.6 mg/dL   GFR calc non Af Amer 79 (*) >90 mL/min   GFR calc Af Amer >90  >90 mL/min     Imaging Review Dg Chest 2 View  05/04/2013   CLINICAL DATA:  Cough, fever, and neck pain  EXAM: CHEST  2 VIEW  COMPARISON:  None available  FINDINGS: The cardiac and mediastinal silhouettes are within normal limits.  The lungs are normally inflated. No airspace consolidation, pleural effusion, or pulmonary edema is identified. There is no pneumothorax.  No acute osseous abnormality identified.  IMPRESSION: No radiographic evidence of acute cardiopulmonary disease. No focal infiltrates identified.   Electronically Signed   By: Rise Mu M.D.   On: 05/04/2013 06:00      MDM   1. Viral infection        Angus Seller, PA-C 05/04/13 [phone]

## 2013-05-06 LAB — CULTURE, GROUP A STREP

## 2013-05-14 ENCOUNTER — Encounter (HOSPITAL_COMMUNITY): Payer: Self-pay | Admitting: Emergency Medicine

## 2013-05-14 ENCOUNTER — Emergency Department (HOSPITAL_COMMUNITY)
Admission: EM | Admit: 2013-05-14 | Discharge: 2013-05-14 | Disposition: A | Discharge status: Home / Self Care | Payer: Commercial Managed Care - PPO | Attending: Emergency Medicine | Admitting: Emergency Medicine

## 2013-05-14 DIAGNOSIS — Z8719 Personal history of other diseases of the digestive system: Secondary | ICD-10-CM | POA: Insufficient documentation

## 2013-05-14 DIAGNOSIS — Z79899 Other long term (current) drug therapy: Secondary | ICD-10-CM | POA: Insufficient documentation

## 2013-05-14 DIAGNOSIS — I10 Essential (primary) hypertension: Secondary | ICD-10-CM | POA: Insufficient documentation

## 2013-05-14 DIAGNOSIS — IMO0002 Reserved for concepts with insufficient information to code with codable children: Secondary | ICD-10-CM | POA: Insufficient documentation

## 2013-05-14 DIAGNOSIS — R5381 Other malaise: Secondary | ICD-10-CM | POA: Insufficient documentation

## 2013-05-14 DIAGNOSIS — Z792 Long term (current) use of antibiotics: Secondary | ICD-10-CM | POA: Insufficient documentation

## 2013-05-14 DIAGNOSIS — R42 Dizziness and giddiness: Secondary | ICD-10-CM

## 2013-05-14 DIAGNOSIS — T481X5A Adverse effect of skeletal muscle relaxants [neuromuscular blocking agents], initial encounter: Secondary | ICD-10-CM | POA: Insufficient documentation

## 2013-05-14 DIAGNOSIS — T50905A Adverse effect of unspecified drugs, medicaments and biological substances, initial encounter: Secondary | ICD-10-CM

## 2013-05-14 DIAGNOSIS — M542 Cervicalgia: Secondary | ICD-10-CM

## 2013-05-14 LAB — POCT I-STAT, CHEM 8
Chloride: 101 mEq/L (ref 96–112)
HCT: 40 % (ref 36.0–46.0)
Hemoglobin: 13.6 g/dL (ref 12.0–15.0)
Potassium: 3.7 mEq/L (ref 3.5–5.1)
Sodium: 141 mEq/L (ref 135–145)

## 2013-05-14 LAB — GLUCOSE, CAPILLARY: Glucose-Capillary: 84 mg/dL (ref 70–99)

## 2013-05-14 MED ORDER — IBUPROFEN 800 MG PO TABS: 800.0000 mg | ORAL_TABLET | Freq: Three times a day (TID) | ORAL | Status: AC

## 2013-05-14 MED ORDER — SODIUM CHLORIDE 0.9 % IV BOLUS (SEPSIS)
1000.0000 mL | Freq: Once | INTRAVENOUS | Status: AC
  Administered 2013-05-14: 1000 mL via INTRAVENOUS

## 2013-05-14 MED ORDER — ONDANSETRON HCL 4 MG/2ML IJ SOLN
4.0000 mg | Freq: Once | INTRAMUSCULAR | Status: AC
  Administered 2013-05-14: 4 mg via INTRAVENOUS
  Filled 2013-05-14: qty 2

## 2013-05-14 NOTE — ED Provider Notes (Signed)
TIME SEEN: 1:03 PM  CHIEF COMPLAINT: Generalized weakness, lightheadedness  HPI: Patient is a 57 y.o. female with a history of hypertension who presents the emergency department with generalized weakness and lightheadedness that started approximately 15 minutes after taking tizanidine 4 mg for neck spasm. She was recently prescribed this medication by her primary care physician for muscle spasms. She denies ever taking this medication before. She states that she has had some mild nausea. No chest pain or shortness of breath. No recent vomiting or diarrhea. No bloody stools or melena. She has been eating and drinking well. No numbness, tingling or focal weakness.  ROS: See HPI Constitutional: no fever  Eyes: no drainage  ENT: no runny nose   Cardiovascular:  no chest pain  Resp: no SOB  GI: no vomiting GU: no dysuria Integumentary: no rash  Allergy: no hives  Musculoskeletal: no leg swelling  Neurological: no slurred speech ROS otherwise negative  PAST MEDICAL HISTORY/PAST SURGICAL HISTORY:  Past Medical History  Diagnosis Date  . Hypertension   . GERD (gastroesophageal reflux disease)     hx no meds    MEDICATIONS:  Prior to Admission medications   Medication Sig Start Date End Date Taking? Authorizing Provider  acetaminophen-codeine 120-12 MG/5ML solution Take 10 mLs by mouth every 4 (four) hours as needed for moderate pain. 05/02/13  Yes Jamesetta Orleans Lawyer, PA-C  azithromycin (ZITHROMAX) 250 MG tablet Take 250 mg by mouth daily.   Yes Historical Provider, MD  guaiFENesin (MUCINEX) 600 MG 12 hr tablet Take 1,200 mg by mouth 2 (two) times daily as needed for cough.   Yes Historical Provider, MD  losartan-hydrochlorothiazide (HYZAAR) 100-12.5 MG per tablet Take 1 tablet by mouth daily.   Yes Historical Provider, MD  Menthol, Topical Analgesic, (BLUE-EMU MAXIMUM STRENGTH EX) Apply 1 application topically 3 (three) times daily as needed (back pain).   Yes Historical Provider, MD   predniSONE (DELTASONE) 50 MG tablet Take 1 tablet (50 mg total) by mouth daily. 05/02/13  Yes Jamesetta Orleans Lawyer, PA-C  tiZANidine (ZANAFLEX) 4 MG tablet Take 4 mg by mouth every 6 (six) hours as needed for muscle spasms.   Yes Historical Provider, MD  Vitamin D, Ergocalciferol, (DRISDOL) 50000 UNITS CAPS Take 50,000 Units by mouth every 7 (seven) days.   Yes Historical Provider, MD    ALLERGIES:  No Known Allergies  SOCIAL HISTORY:  History  Substance Use Topics  . Smoking status: Never Smoker   . Smokeless tobacco: Never Used  . Alcohol Use: No    FAMILY HISTORY: Family History  Problem Relation Age of Onset  . Hypertension Mother   . Diabetes Mother   . Cancer Mother   . Diabetes Father   . Hypertension Father     EXAM: BP 90/56  Pulse 77  Temp(Src) 97.8 F (36.6 C) (Oral)  Resp 18  SpO2 94% CONSTITUTIONAL: Alert and oriented and responds appropriately to questions. Well-appearing; well-nourished HEAD: Normocephalic EYES: Conjunctivae clear, PERRL ENT: normal nose; no rhinorrhea; moist mucous membranes; pharynx without lesions noted NECK: Supple, no meningismus, no LAD; patient is tender to palpation over her bilateral trapezius muscles with no midline tenderness, step-off or deformity CARD: RRR; S1 and S2 appreciated; no murmurs, no clicks, no rubs, no gallops RESP: Normal chest excursion without splinting or tachypnea; breath sounds clear and equal bilaterally; no wheezes, no rhonchi, no rales,  ABD/GI: Normal bowel sounds; non-distended; soft, non-tender, no rebound, no guarding BACK:  The back appears normal and is non-tender  to palpation, there is no CVA tenderness EXT: Normal ROM in all joints; non-tender to palpation; no edema; normal capillary refill; no cyanosis    SKIN: Normal color for age and race; warm NEURO: Moves all extremities equally; strength 5/5 in all 4 extremities, sensation to light touch intact diffusely, no drift, cranial nerves II through  XII intact PSYCH: The patient's mood and manner are appropriate. Grooming and personal hygiene are appropriate.  MEDICAL DECISION MAKING: Patient here with generalized weakness and lightheadedness after taking muscle relaxant. Suspect this was a medication side effect. Patient's blood pressure is 90/56 with a heart rate of 77. We'll give IV fluids and reassess. We'll check basic blood work. EKG shows no ischemic changes. Unchanged compared to prior.  ED PROGRESS: Patient reports feeling much better after IV fluids and Zofran. Her blood pressure has improved. Labs are unremarkable. We'll discharge home with prescription for ibuprofen for her neck pain. Instructed patient to avoid taking tizanidine or any other medication similar to this at this time given she was extremely symptomatic to their side effects. Have given return precautions. Patient has primary care followup. She verbalizes understanding and is comfortable with plan.  EKG Interpretation    Date/Time:  Thursday May 14 2013 13:24:09 EST Ventricular Rate:  72 PR Interval:  132 QRS Duration: 85 QT Interval:  377 QTC Calculation: 412 R Axis:   27 Text Interpretation:  Sinus rhythm Ventricular bigeminy Abnormal R-wave progression, early transition Borderline T abnormalities, anterior leads that is similar to prior EKG 09/24/12 Baseline wander in lead(s) V2 Confirmed by WARD  DO, KRISTEN ([id number]) on 05/14/2013 1:31:08 PM             Layla Maw Ward, DO 05/14/13 1527

## 2013-05-14 NOTE — ED Notes (Signed)
Patient reports that she has been having neck pain and was prescribed Tizanidine HCL 4 mg. Patient states she took the med at 0900 this AM and then she began having dizziness and weakness afterwards.

## 2013-05-14 NOTE — ED Notes (Signed)
EDP at bedside  

## 2013-07-07 ENCOUNTER — Encounter (HOSPITAL_COMMUNITY): Payer: Self-pay | Admitting: Emergency Medicine

## 2013-07-07 ENCOUNTER — Emergency Department (HOSPITAL_COMMUNITY)
Admission: EM | Admit: 2013-07-07 | Discharge: 2013-07-07 | Disposition: A | Discharge status: Home / Self Care | Payer: Commercial Managed Care - PPO | Source: Home / Self Care

## 2013-07-07 DIAGNOSIS — J329 Chronic sinusitis, unspecified: Secondary | ICD-10-CM

## 2013-07-07 DIAGNOSIS — J310 Chronic rhinitis: Secondary | ICD-10-CM

## 2013-07-07 DIAGNOSIS — R141 Gas pain: Secondary | ICD-10-CM

## 2013-07-07 DIAGNOSIS — J069 Acute upper respiratory infection, unspecified: Secondary | ICD-10-CM

## 2013-07-07 DIAGNOSIS — R143 Flatulence: Secondary | ICD-10-CM

## 2013-07-07 DIAGNOSIS — R142 Eructation: Secondary | ICD-10-CM

## 2013-07-07 NOTE — ED Notes (Deleted)
Pt  Reports  Pain /  Swelling  To  l  Elbow    -     Pt  denys  Any  Injury  Pt       Reports   Has  Had similar episode  In past            Pt has  Tenderness  And  Swelling to  The  l  Elbow   Area

## 2013-07-07 NOTE — ED Notes (Signed)
Pt c/o cold sxs onset yest w/sxs that include: cough, ST, abd pain, BA Denies: f/v/n/d, SOB, wheezing... Taking mucinex OTC w/temp relief Alert w/no signs of acute distress.

## 2013-07-07 NOTE — ED Provider Notes (Signed)
History of Present Illness   Patient Identification Claire Torres is a 58 y.o. female.   Chief Complaint  URI    Past Medical History  Diagnosis Date  . Hypertension   . GERD (gastroesophageal reflux disease)     hx no meds   Family History  Problem Relation Age of Onset  . Hypertension Mother   . Diabetes Mother   . Cancer Mother   . Diabetes Father   . Hypertension Father    No current facility-administered medications for this encounter.   Current Outpatient Prescriptions  Medication Sig Dispense Refill  . guaiFENesin (MUCINEX) 600 MG 12 hr tablet Take 1,200 mg by mouth 2 (two) times daily as needed for cough.      Marland Kitchen ibuprofen (ADVIL,MOTRIN) 800 MG tablet Take 1 tablet (800 mg total) by mouth 3 (three) times daily.  21 tablet  0  . losartan-hydrochlorothiazide (HYZAAR) 100-12.5 MG per tablet Take 1 tablet by mouth daily.      . Menthol, Topical Analgesic, (BLUE-EMU MAXIMUM STRENGTH EX) Apply 1 application topically 3 (three) times daily as needed (back pain).      Marland Kitchen tiZANidine (ZANAFLEX) 4 MG tablet Take 4 mg by mouth every 6 (six) hours as needed for muscle spasms.      . Vitamin D, Ergocalciferol, (DRISDOL) 50000 UNITS CAPS Take 50,000 Units by mouth every 7 (seven) days.       No Known Allergies History   Social History  . Marital Status: Single    Spouse Name: N/A    Number of Children: N/A  . Years of Education: N/A   Occupational History  . Not on file.   Social History Main Topics  . Smoking status: Never Smoker   . Smokeless tobacco: Never Used  . Alcohol Use: No  . Drug Use: No  . Sexual Activity: Not on file   Other Topics Concern  . Not on file   Social History Narrative  . No narrative on file   Review of Systems   Physical Exam   BP 115/77  Pulse 99  Temp(Src) 98.9 F (37.2 C) (Oral)  Resp 18  SpO2 98%   ED Course     Arrival date & time 07/07/13  0807 History   First MD Initiated Contact with Patient 07/07/13 0827      Chief Complaint  Patient presents with  . URI   (Consider location/radiation/quality/duration/timing/severity/associated sxs/prior Treatment) HPI Comments: URI complaints Pain across lower abdomen yesterday that abated after BM   Past Medical History  Diagnosis Date  . Hypertension   . GERD (gastroesophageal reflux disease)     hx no meds   Past Surgical History  Procedure Laterality Date  . Tubal ligation    . Colonoscopy    . Upper gi endoscopy    . Trigger finger release Right 09/29/2012    Procedure: RELEASE A-1 PULLEY RIGHT MIDDLE FINGER ;  Surgeon: Nicki Reaper, MD;  Location: [facility];  Service: Orthopedics;  Laterality: Right;   Family History  Problem Relation Age of Onset  . Hypertension Mother   . Diabetes Mother   . Cancer Mother   . Diabetes Father   . Hypertension Father    History  Substance Use Topics  . Smoking status: Never Smoker   . Smokeless tobacco: Never Used  . Alcohol Use: No   OB History   Grav Para Term Preterm Abortions TAB SAB Ect Mult Living  Review of Systems  Constitutional: Positive for appetite change. Negative for fever, chills, activity change and fatigue.  HENT: Positive for congestion, postnasal drip and rhinorrhea. Negative for facial swelling and sore throat.   Eyes: Negative.   Respiratory: Positive for cough. Negative for shortness of breath.   Cardiovascular: Negative.   Gastrointestinal: Negative for diarrhea and anal bleeding.       Per HPI  Musculoskeletal: Negative.  Negative for neck pain and neck stiffness.  Skin: Negative for pallor and rash.  Neurological: Negative.   Psychiatric/Behavioral: Negative.     Allergies  Review of patient's allergies indicates no known allergies.  Home Medications   Current Outpatient Rx  Name  Route  Sig  Dispense  Refill  . guaiFENesin (MUCINEX) 600 MG 12 hr tablet   Oral   Take 1,200 mg by mouth 2 (two) times daily as needed for cough.          Marland Kitchen. ibuprofen (ADVIL,MOTRIN) 800 MG tablet   Oral   Take 1 tablet (800 mg total) by mouth 3 (three) times daily.   21 tablet   0   . losartan-hydrochlorothiazide (HYZAAR) 100-12.5 MG per tablet   Oral   Take 1 tablet by mouth daily.         . Menthol, Topical Analgesic, (BLUE-EMU MAXIMUM STRENGTH EX)   Apply externally   Apply 1 application topically 3 (three) times daily as needed (back pain).         Marland Kitchen. tiZANidine (ZANAFLEX) 4 MG tablet   Oral   Take 4 mg by mouth every 6 (six) hours as needed for muscle spasms.         . Vitamin D, Ergocalciferol, (DRISDOL) 50000 UNITS CAPS   Oral   Take 50,000 Units by mouth every 7 (seven) days.          BP 115/77  Pulse 99  Temp(Src) 98.9 F (37.2 C) (Oral)  Resp 18  SpO2 98% Physical Exam  Nursing note and vitals reviewed. Constitutional: She is oriented to person, place, and time. She appears well-developed and well-nourished. No distress.  HENT:  Right Ear: External ear normal.  Left Ear: External ear normal.  Mouth/Throat: No oropharyngeal exudate.  Minor erythema and clear PND  Eyes: Conjunctivae and EOM are normal.  Neck: Normal range of motion. Neck supple.  Cardiovascular: Normal rate, regular rhythm and intact distal pulses.   Pulmonary/Chest: Effort normal and breath sounds normal. No respiratory distress. She has no wheezes. She has no rales.  Abdominal: Soft. Bowel sounds are normal. She exhibits no distension. There is no tenderness. There is no rebound and no guarding.  Musculoskeletal: Normal range of motion. She exhibits no edema.  Lymphadenopathy:    She has no cervical adenopathy.  Neurological: She is alert and oriented to person, place, and time.  Skin: Skin is warm and dry. No rash noted.  Psychiatric: She has a normal mood and affect.    ED Course  Procedures (including critical care time) Labs Review Labs Reviewed - No data to display Imaging Review No results found.    MDM   1.  URI (upper respiratory infection)   2. Rhinosinusitis   3. Abdominal gas pain     Alka seltzer Cold Plus and Robitussin DM Fluids Rest    Hayden Rasmussenavid Guido Comp, NP 07/07/13 [phone]

## 2013-07-07 NOTE — Discharge Instructions (Signed)
Abdominal Pain, Adult Many things can cause belly (abdominal) pain. Most times, the belly pain is not dangerous. Many cases of belly pain can be watched and treated at home. HOME CARE   Do not take medicines that help you go poop (laxatives) unless told to by your doctor.  Only take medicine as told by your doctor.  Eat or drink as told by your doctor. Your doctor will tell you if you should be on a special diet. GET HELP IF:  You do not know what is causing your belly pain.  You have belly pain while you are sick to your stomach (nauseous) or have runny poop (diarrhea).  You have pain while you pee or poop.  Your belly pain wakes you up at night.  You have belly pain that gets worse or better when you eat.  You have belly pain that gets worse when you eat fatty foods. GET HELP RIGHT AWAY IF:   The pain does not go away within 2 hours.  You have a fever.  You keep throwing up (vomiting).  The pain changes and is only in the right or left part of the belly.  You have bloody or tarry looking poop. MAKE SURE YOU:   Understand these instructions.  Will watch your condition.  Will get help right away if you are not doing well or get worse. Document Released: 11/28/2007 Document Revised: 04/01/2013 Document Reviewed: 02/18/2013 [facility] Patient Information 2014 [place], [place].  Cough, Adult  A cough is a reflex. It helps you clear your throat and airways. A cough can help heal your body. A cough can last 2 or 3 weeks (acute) or may last more than 8 weeks (chronic). Some common causes of a cough can include an infection, allergy, or a cold. HOME CARE  Only take medicine as told by your doctor.  If given, take your medicines (antibiotics) as told. Finish them even if you start to feel better.  Use a cold steam vaporizer or humidier in your home. This can help loosen thick spit (secretions).  Sleep so you are almost sitting up (semi-upright). Use pillows to do this. This  helps reduce coughing.  Rest as needed.  Stop smoking if you smoke. GET HELP RIGHT AWAY IF:  You have yellowish-white fluid (pus) in your thick spit.  Your cough gets worse.  Your medicine does not reduce coughing, and you are losing sleep.  You cough up blood.  You have trouble breathing.  Your pain gets worse and medicine does not help.  You have a fever. MAKE SURE YOU:   Understand these instructions.  Will watch your condition.  Will get help right away if you are not doing well or get worse. Document Released: 02/22/2011 Document Revised: 09/03/2011 Document Reviewed: 02/22/2011 [facility] Patient Information 2014 [place], [place].  Upper Respiratory Infection, Adult Alka Seltzer Cold Plus Night Time and Robitussin DM An upper respiratory infection (URI) is also known as the common cold. It is often caused by a type of germ (virus). Colds are easily spread (contagious). You can pass it to others by kissing, coughing, sneezing, or drinking out of the same glass. Usually, you get better in 1 or 2 weeks.  HOME CARE   Only take medicine as told by your doctor.  Use a warm mist humidifier or breathe in steam from a hot shower.  Drink enough water and fluids to keep your pee (urine) clear or pale yellow.  Get plenty of rest.  Return to work  when your temperature is back to normal or as told by your doctor. You may use a face mask and wash your hands to stop your cold from spreading. GET HELP RIGHT AWAY IF:   After the first few days, you feel you are getting worse.  You have questions about your medicine.  You have chills, shortness of breath, or brown or red spit (mucus).  You have yellow or brown snot (nasal discharge) or pain in the face, especially when you bend forward.  You have a fever, puffy (swollen) neck, pain when you swallow, or white spots in the back of your throat.  You have a bad headache, ear pain, sinus pain, or chest pain.  You have a  high-pitched whistling sound when you breathe in and out (wheezing).  You have a lasting cough or cough up blood.  You have sore muscles or a stiff neck. MAKE SURE YOU:   Understand these instructions.  Will watch your condition.  Will get help right away if you are not doing well or get worse. Document Released: 11/28/2007 Document Revised: 09/03/2011 Document Reviewed: 10/16/2010 [facility] Patient Information 2014 [place], [place].

## 2013-07-07 NOTE — ED Provider Notes (Signed)
Medical screening examination/treatment/procedure(s) were performed by resident physician or non-physician practitioner and as supervising physician I was immediately available for consultation/collaboration.   Mccall Lomax DOUGLAS MD.   Carsyn Boster D Davida Falconi, MD 07/07/13 1605 

## 2014-12-28 ENCOUNTER — Emergency Department (HOSPITAL_COMMUNITY)
Admission: EM | Admit: 2014-12-28 | Discharge: 2014-12-28 | Disposition: A | Discharge status: Home / Self Care | Payer: Commercial Managed Care - PPO | Attending: Emergency Medicine | Admitting: Emergency Medicine

## 2014-12-28 ENCOUNTER — Encounter (HOSPITAL_COMMUNITY): Payer: Self-pay

## 2014-12-28 DIAGNOSIS — Z8719 Personal history of other diseases of the digestive system: Secondary | ICD-10-CM | POA: Insufficient documentation

## 2014-12-28 DIAGNOSIS — Z79899 Other long term (current) drug therapy: Secondary | ICD-10-CM | POA: Insufficient documentation

## 2014-12-28 DIAGNOSIS — R55 Syncope and collapse: Secondary | ICD-10-CM | POA: Diagnosis not present

## 2014-12-28 DIAGNOSIS — I10 Essential (primary) hypertension: Secondary | ICD-10-CM | POA: Diagnosis not present

## 2014-12-28 LAB — CBC
HCT: 37.5 % (ref 36.0–46.0)
HEMOGLOBIN: 12.5 g/dL (ref 12.0–15.0)
MCH: 31.2 pg (ref 26.0–34.0)
MCHC: 33.3 g/dL (ref 30.0–36.0)
MCV: 93.5 fL (ref 78.0–100.0)
PLATELETS: 270 10*3/uL (ref 150–400)
RBC: 4.01 MIL/uL (ref 3.87–5.11)
RDW: 13.8 % (ref 11.5–15.5)
WBC: 7 10*3/uL (ref 4.0–10.5)

## 2014-12-28 LAB — BASIC METABOLIC PANEL
Anion gap: 8 (ref 5–15)
BUN: 12 mg/dL (ref 6–20)
CALCIUM: 8.7 mg/dL — AB (ref 8.9–10.3)
CO2: 26 mmol/L (ref 22–32)
CREATININE: 1.07 mg/dL — AB (ref 0.44–1.00)
Chloride: 102 mmol/L (ref 101–111)
GFR calc Af Amer: >60 mL/min (ref >60)
GFR calc non Af Amer: 56 mL/min — ABNORMAL LOW (ref >60)
Glucose, Bld: 105 mg/dL — ABNORMAL HIGH (ref 65–99)
POTASSIUM: 3.3 mmol/L — AB (ref 3.5–5.1)
SODIUM: 136 mmol/L (ref 135–145)

## 2014-12-28 LAB — CBG MONITORING, ED: Glucose-Capillary: 112 mg/dL — ABNORMAL HIGH (ref 65–99)

## 2014-12-28 NOTE — Discharge Instructions (Signed)
Near-Syncope Near-syncope (commonly known as near fainting) is sudden weakness, dizziness, or feeling like you might pass out. During an episode of near-syncope, you may also develop pale skin, have tunnel vision, or feel sick to your stomach (nauseous). Near-syncope may occur when getting up after sitting or while standing for a long time. It is caused by a sudden decrease in blood flow to the brain. This decrease can result from various causes or triggers, most of which are not serious. However, because near-syncope can sometimes be a sign of something serious, a medical evaluation is required. The specific cause is often not determined. HOME CARE INSTRUCTIONS  Monitor your condition for any changes. The following actions may help to alleviate any discomfort you are experiencing:  Have someone stay with you until you feel stable.  Lie down right away and prop your feet up if you start feeling like you might faint. Breathe deeply and steadily. Wait until all the symptoms have passed. Most of these episodes last only a few minutes. You may feel tired for several hours.   Drink enough fluids to keep your urine clear or pale yellow.   If you are taking blood pressure or heart medicine, get up slowly when seated or lying down. Take several minutes to sit and then stand. This can reduce dizziness.  Follow up with your health care provider as directed. SEEK IMMEDIATE MEDICAL CARE IF:   You have a severe headache.   You have unusual pain in the chest, abdomen, or back.   You are bleeding from the mouth or rectum, or you have black or tarry stool.   You have an irregular or very fast heartbeat.   You have repeated fainting or have seizure-like jerking during an episode.   You faint when sitting or lying down.   You have confusion.   You have difficulty walking.   You have severe weakness.   You have vision problems.  MAKE SURE YOU:   Understand these instructions.  Will  watch your condition.  Will get help right away if you are not doing well or get worse. Document Released: 06/11/2005 Document Revised: 06/16/2013 Document Reviewed: 11/14/2012 [employer] Patient Information 2015 [employer]. This information is not intended to replace advice given to you by your health care provider. Make sure you discuss any questions you have with your health care provider.  

## 2014-12-28 NOTE — ED Provider Notes (Addendum)
CSN: [id number]     Arrival date & time 12/28/14  1807 History   First MD Initiated Contact with Patient 12/28/14 1821     Chief Complaint  Patient presents with  . Near Syncope     (Consider location/radiation/quality/duration/timing/severity/associated sxs/prior Treatment) HPI   Claire Torres is a 59 y.o. female who was eating dinner, when she felt lightheaded and weak, so she laid down. First responders found her blood pressure low at 60 palpated. EMS arrived and he was improved by that time. Patient reports that she is feeling better. She took her usual medications today. No recent N/V, fever/chills, cough or chest pain. 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   Past Medical History  Diagnosis Date  . Hypertension   . GERD (gastroesophageal reflux disease)     hx no meds   Past Surgical History  Procedure Laterality Date  . Tubal ligation    . Colonoscopy    . Upper gi endoscopy    . Trigger finger release Right 09/29/2012    Procedure: RELEASE A-1 PULLEY RIGHT MIDDLE FINGER ;  Surgeon: Nicki ReaperGary R Kuzma, MD;  [facility]:  [facility];  Service: Orthopedics;  Laterality: Right;   Family History  Problem Relation Age of Onset  . Hypertension Mother   . Diabetes Mother   . Cancer Mother   . Diabetes Father   . Hypertension Father    History  Substance Use Topics  . Smoking status: Never Smoker   . Smokeless tobacco: Never Used  . Alcohol Use: No   OB History    No data available     Review of Systems    Allergies  Review of patient's allergies indicates no known allergies.  Home Medications   Prior to Admission medications   Medication Sig Start Date End Date Taking? Authorizing Provider  guaiFENesin (MUCINEX) 600 MG 12 hr tablet Take 1,200 mg by mouth 2 (two) times daily as needed for cough.    Historical Provider, MD  ibuprofen (ADVIL,MOTRIN) 800 MG tablet Take 1 tablet (800 mg total) by mouth 3 (three) times daily. 05/14/13   Kristen N Ward, DO   losartan-hydrochlorothiazide (HYZAAR) 100-12.5 MG per tablet Take 1 tablet by mouth daily.    Historical Provider, MD  Menthol, Topical Analgesic, (BLUE-EMU MAXIMUM STRENGTH EX) Apply 1 application topically 3 (three) times daily as needed (back pain).    Historical Provider, MD  tiZANidine (ZANAFLEX) 4 MG tablet Take 4 mg by mouth every 6 (six) hours as needed for muscle spasms.    Historical Provider, MD  Vitamin D, Ergocalciferol, (DRISDOL) 50000 UNITS CAPS Take 50,000 Units by mouth every 7 (seven) days.    Historical Provider, MD   BP 138/80 mmHg  Pulse 85  Temp(Src) 98.1 F (36.7 C) (Oral)  Resp 18  SpO2 100% Physical Exam  Constitutional: She is oriented to person, place, and time. She appears well-developed and well-nourished.  HENT:  Head: Normocephalic and atraumatic.  Right Ear: External ear normal.  Left Ear: External ear normal.  Eyes: Conjunctivae and EOM are normal. Pupils are equal, round, and reactive to light.  Neck: Normal range of motion and phonation normal. Neck supple.  Cardiovascular: Normal rate, regular rhythm and normal heart sounds.   Pulmonary/Chest: Effort normal and breath sounds normal. She exhibits no bony tenderness.  Abdominal: Soft. There is no tenderness.  Musculoskeletal: Normal range of motion.  Neurological: She is alert and oriented to person, place, and time. No cranial nerve deficit or  sensory deficit. She exhibits normal muscle tone. Coordination normal.  Skin: Skin is warm, dry and intact.  Psychiatric: She has a normal mood and affect. Her behavior is normal. Judgment and thought content normal.  Nursing note and vitals reviewed.   ED Course  Procedures (including critical care time)  Medications - No data to display  Patient Vitals for the past 24 hrs:  BP Temp Temp src Pulse Resp SpO2  12/28/14 2000 115/89 mmHg - - 81 12 100 %  12/28/14 1930 116/81 mmHg - - 86 15 99 %  12/28/14 1900 121/80 mmHg - - 80 21 98 %  12/28/14 1830  117/74 mmHg - - 83 13 98 %  12/28/14 1812 138/80 mmHg 98.1 F (36.7 C) Oral 85 18 100 %    8:45 PM Reevaluation with update and discussion. After initial assessment and treatment, an updated evaluation reveals she is tolerating oral fluids. No additional questions or concerns. Findings discussed with the patient, all questions answered. Desaree Downen L    Labs Review Labs Reviewed  CBG MONITORING, ED - Abnormal; Notable for the following:    Glucose-Capillary 112 (*)    All other components within normal limits  CBC  BASIC METABOLIC PANEL    Imaging Review No results found.   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 No evidence for serious bacterial infection, metabolic instability or impending vascular collapse.  Nursing Notes Reviewed/ Care Coordinated Applicable Imaging Reviewed Interpretation of Laboratory Data incorporated into ED treatment  The patient appears reasonably screened and/or stabilized for discharge and I doubt any other medical condition or other [facility] requiring further screening, evaluation, or treatment in the ED at this time prior to discharge.  Plan: Home Medications- usual; Home Treatments- rest, increase fluids; return here if the recommended treatment, does not improve the symptoms; Recommended follow up- PCP prn   Mancel Bale, MD 12/28/14 6045  Mancel Bale, MD 01/09/15 [phone]

## 2014-12-28 NOTE — ED Notes (Signed)
PER EMS: pt here for near syncopal episode while eating dinner tonight at a restaurant. She began to feel lightheaded and weak and so she laid herself down to the ground. No LOC. Fire squad arrived and BP was 60 palpated and upon EMS arrival pt's BP was 120 palpated. Pt A&OX4. She states she took her Losartan tablet around 1630 and after that is when she started to feel lightheaded.

## 2015-04-04 ENCOUNTER — Encounter: Payer: Self-pay | Admitting: Neurology

## 2015-04-04 ENCOUNTER — Ambulatory Visit (INDEPENDENT_AMBULATORY_CARE_PROVIDER_SITE_OTHER): Payer: Commercial Managed Care - PPO | Admitting: Neurology

## 2015-04-04 VITALS — BP 150/92 | HR 70 | Ht 63.0 in | Wt 152.0 lb

## 2015-04-04 DIAGNOSIS — M5010 Cervical disc disorder with radiculopathy, unspecified cervical region: Secondary | ICD-10-CM

## 2015-04-04 NOTE — Progress Notes (Signed)
PATIENT: Claire Torres DOB: 08-14-1955  Chief Complaint  Patient presents with  . Neck Pain    She has been having progressively worse neck pain over the last 2-3 years.  Pain has now started to radiate into her right arm.     HISTORICAL  Claire Torres is a 59 years old left-handed female, seen in refer by her rheumatologist Dr. Francee Gentile, and primary care physician Dr. 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 She denies gait difficulty, she used to be heavily left handed dominant, now tends to use her right hand.  She has tried gabapentin 300 mg 2 tablets each day, which was helpful, ibuprofen 800 mg as needed was helpful 2. REVIEW OF SYSTEMS: Full 14 system review of systems performed and notable only for joint pain, achy muscles, running nose  ALLERGIES: No Known Allergies  HOME MEDICATIONS: Current Outpatient Prescriptions  Medication Sig Dispense Refill  . Cyanocobalamin (VITAMIN B-12 PO) Take 1,000 mg by mouth.     . gabapentin (NEURONTIN) 300 MG capsule Take 300 mg by mouth 2 (two) times daily.  1  . ibuprofen (ADVIL,MOTRIN) 800 MG tablet Take 1 tablet (800 mg total) by mouth 3 (three) times daily. 21 tablet 0  . losartan-hydrochlorothiazide (HYZAAR) 100-12.5 MG per tablet Take 1 tablet by mouth daily.    . Vitamin D, Ergocalciferol, (DRISDOL) 50000 UNITS CAPS Take 50,000 Units by mouth every 7 (seven) days. On Wednesdays     No current facility-administered  medications for this visit.    PAST MEDICAL HISTORY: Past Medical History  Diagnosis Date  . Hypertension   . GERD (gastroesophageal reflux disease)     hx no meds  . Neck pain     PAST SURGICAL HISTORY: Past Surgical History  Procedure Laterality Date  . Tubal ligation    . Colonoscopy    . Upper gi endoscopy    . Trigger finger release Right 09/29/2012    Procedure: RELEASE A-1 PULLEY RIGHT MIDDLE FINGER ;  Surgeon: Nicki Reaper, MD;  Location: [facility];  Service: Orthopedics;  Laterality: Right;    FAMILY HISTORY: Family History  Problem Relation Age of Onset  . Hypertension Mother   . Diabetes Mother   . Cancer Mother   . Diabetes Father   . Hypertension Father     SOCIAL HISTORY:  Social History   Social History  . Marital Status: Single    Spouse Name: N/A  . Number of Children: 1  . 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 Smoking status: Never Smoker   . Smokeless tobacco: Never Used  . Alcohol Use: No  . Drug Use: No  . Sexual Activity: Not on file   Other Topics Concern  . Not on file   Social History Narrative   Lives at home with her daughter.   Ambidextrous   1-2 cups  tea per day.    PHYSICAL EXAM   Filed Vitals:   04/04/15 0936  BP: 150/92  Pulse: 70  Height:  (1.6 m)  Weight: 152 lb (68.947 kg)    Not recorded      Body mass index is 26.93 kg/(m^2).  PHYSICAL EXAMNIATION:  Gen: NAD, conversant, well nourised, obese, well groomed                     Cardiovascular: Regular rate rhythm, no peripheral edema, warm, nontender. Eyes: Conjunctivae clear without exudates or hemorrhage Neck: Supple, no carotid bruise. Pulmonary: Clear to auscultation bilaterally   NEUROLOGICAL EXAM:  MENTAL STATUS: Speech:    Speech is normal; fluent and spontaneous with normal comprehension.  Cognition:     Orientation to time, place and  person     Normal recent and remote memory     Normal Attention span and concentration     Normal Language, naming, repeating,spontaneous speech     Fund of knowledge   CRANIAL NERVES: CN II: Visual fields are full to confrontation. Fundoscopic exam is normal with sharp discs and no vascular changes. Pupils are round equal and briskly reactive to light. CN III, IV, VI: extraocular movement are normal. No ptosis. CN V: Facial sensation is intact to pinprick in all 3 divisions bilaterally. Corneal responses are intact.  CN VII: Face is symmetric with normal eye closure and smile. CN VIII: Hearing is normal to rubbing fingers CN IX, X: Palate elevates symmetrically. Phonation is normal. CN XI: Head turning and shoulder shrug are intact CN XII: Tongue is midline with normal movements and no atrophy.  MOTOR:  She has slight right shoulder abduction, external rotation weakness, normal tone, muscle bulk  REFLEXES: Reflexes are 2+ and symmetric at the biceps, triceps, knees, and ankles. Plantar responses are flexor.  SENSORY: Intact to light touch, pinprick, position sense, and vibration sense are intact in fingers and toes.  COORDINATION: Rapid alternating movements and fine finger movements are intact. There is no dysmetria on finger-to-nose and heel-knee-shin.    GAIT/STANCE: Posture is normal. Gait is steady with normal steps, base, arm swing, and turning. Heel and toe walking are normal. Tandem gait is normal.  Romberg is absent.   DIAGNOSTIC DATA (LABS, IMAGING, TESTING) - I reviewed patient records, labs, notes, testing and imaging myself where available.   ASSESSMENT AND PLAN  Claire Torres is a 59 y.o. 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 Ph.D.  [facility] [address], [address] [place], [place] [address] Ph: [phone] Fax: [phone]  CC: Francee Gentile, MD,George Osei-Bonsu, MD

## 2015-05-04 ENCOUNTER — Ambulatory Visit (INDEPENDENT_AMBULATORY_CARE_PROVIDER_SITE_OTHER): Payer: Commercial Managed Care - PPO

## 2015-05-04 DIAGNOSIS — M5010 Cervical disc disorder with radiculopathy, unspecified cervical region: Secondary | ICD-10-CM

## 2015-05-05 ENCOUNTER — Telehealth: Payer: Self-pay | Admitting: Neurology

## 2015-05-05 NOTE — Telephone Encounter (Signed)
Pt called and wants to know if her MRI results have come in . She was also told depending her results she might not need NCS. Is this true. May call [phone]

## 2015-05-06 NOTE — Telephone Encounter (Signed)
Left message letting patient know that we will call her back Monday with the results.

## 2015-05-09 ENCOUNTER — Telehealth: Payer: Self-pay | Admitting: Neurology

## 2015-05-09 NOTE — Telephone Encounter (Signed)
Please call patient, MRI cervical showed mild degenerative changes, no significant canal or foraminal stenosis.  IMPRESSION: Mildly abnormal MRI scan of cervical spine showing mild spondylitic changes most prominent at C4-5 with asymmetric right-sided disc bulge resulting in mild foraminal narrowing without definite compression.

## 2015-05-09 NOTE — Telephone Encounter (Signed)
Left another message on her home number.  No answer on her cell number and no machine available.

## 2015-05-09 NOTE — Telephone Encounter (Signed)
Please call patient, MRI of the cervical showed mild degenerative changes, no significant canal or foraminal stenosis.  IMPRESSION: Mildly abnormal MRI scan of cervical spine showing mild spondylitic changes most prominent at C4-5 with asymmetric right-sided disc bulge resulting in mild foraminal narrowing without definite compression.

## 2015-05-09 NOTE — Telephone Encounter (Signed)
Left message on home number for a return call.  Attempted to reach on cell phone - no answer and mailbox was full.

## 2015-05-10 ENCOUNTER — Encounter: Payer: Commercial Managed Care - PPO | Admitting: Neurology

## 2015-05-10 NOTE — Telephone Encounter (Signed)
Left message for a return call

## 2015-05-10 NOTE — Telephone Encounter (Signed)
Pt returned Michelle's call. She can be reached at [phone]

## 2015-05-10 NOTE — Telephone Encounter (Addendum)
Spoke to patient - she is aware of results.  She will keep her appt for her EMG/NCV and schedule a follow up, after the test.

## 2015-05-12 NOTE — Telephone Encounter (Signed)
LMTC./fim 

## 2015-05-12 NOTE — Telephone Encounter (Signed)
LMTC (home #) Cell # vm is full/fim

## 2015-05-13 NOTE — Telephone Encounter (Signed)
LMTC./fim 

## 2015-05-16 NOTE — Telephone Encounter (Signed)
Patient aware of results.

## 2015-05-16 NOTE — Telephone Encounter (Signed)
Patient returned call

## 2015-07-04 ENCOUNTER — Encounter: Payer: Commercial Managed Care - PPO | Admitting: Neurology

## 2015-08-22 ENCOUNTER — Ambulatory Visit (INDEPENDENT_AMBULATORY_CARE_PROVIDER_SITE_OTHER): Payer: Commercial Managed Care - PPO | Admitting: Neurology

## 2015-08-22 DIAGNOSIS — R202 Paresthesia of skin: Secondary | ICD-10-CM

## 2015-08-22 DIAGNOSIS — M5010 Cervical disc disorder with radiculopathy, unspecified cervical region: Secondary | ICD-10-CM | POA: Diagnosis not present

## 2015-08-22 DIAGNOSIS — G5601 Carpal tunnel syndrome, right upper limb: Secondary | ICD-10-CM

## 2015-08-22 DIAGNOSIS — M542 Cervicalgia: Secondary | ICD-10-CM

## 2015-08-22 DIAGNOSIS — M79601 Pain in right arm: Secondary | ICD-10-CM | POA: Insufficient documentation

## 2015-08-22 DIAGNOSIS — G5600 Carpal tunnel syndrome, unspecified upper limb: Secondary | ICD-10-CM | POA: Insufficient documentation

## 2015-08-22 NOTE — Progress Notes (Signed)
PATIENT: Claire Torres DOB: 09/16/1955  No chief complaint on file.    HISTORICAL  Claire Torres is a 60 years old left-handed female, seen in refer by her rheumatologist Dr. Francee Gentile, and primary care physician Dr. 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 She denies gait difficulty, she used to be heavily left handed dominant, now tends to use her right hand.  She has tried gabapentin 300 mg 2 tablets each day, which was helpful, ibuprofen 800 mg as needed was helpful  Update August 22 2015: I have personally reviewed MRI of the cervical without contrast November 2016, mild degenerative disc disease, most severe at C4-5, with mild right foraminal stenosis, no evidence of nerve root or spinal cord compression.  She continue complains of midline neck pain, radiating pain to bilateral shoulder, no weakness, no gait difficulty, over the past few months, she also noticed intermittent right biceps area pain, worsening with repetitive right elbow flexion extension, she woke up with occasionally right finger paresthesia,  Today's electrodiagnostic study showed evidence of mild to moderate right carpal tunnel syndrome, there is no evidence of right cervical radiculopathy.  REVIEW OF SYSTEMS: Full 14 system review of systems performed and notable only for as above   ALLERGIES: No Known Allergies  HOME MEDICATIONS: Current Outpatient Prescriptions  Medication  Sig Dispense Refill  . Cyanocobalamin (VITAMIN B-12 PO) Take 1,000 mg by mouth.     . gabapentin (NEURONTIN) 300 MG capsule Take 300 mg by mouth 2 (two) times daily.  1  . ibuprofen (ADVIL,MOTRIN) 800 MG tablet Take 1 tablet (800 mg total) by mouth 3 (three) times daily. 21 tablet 0  . losartan-hydrochlorothiazide (HYZAAR) 100-12.5 MG per tablet Take 1 tablet by mouth daily.    . Vitamin D, Ergocalciferol, (DRISDOL) 50000 UNITS CAPS Take 50,000 Units by mouth every 7 (seven) days. On Wednesdays     No current facility-administered medications for this visit.    PAST MEDICAL HISTORY: Past Medical History  Diagnosis Date  . Hypertension   . GERD (gastroesophageal reflux disease)     hx no meds  . Neck pain     PAST SURGICAL HISTORY: Past Surgical History  Procedure Laterality Date  . Tubal ligation    . Colonoscopy    . Upper gi endoscopy    . Trigger finger release Right 09/29/2012    Procedure: RELEASE A-1 PULLEY RIGHT MIDDLE FINGER ;  Surgeon: Nicki Reaper, MD;  Location: [facility];  Service: Orthopedics;  Laterality: Right;    FAMILY HISTORY: Family History  Problem Relation Age of Onset  . Hypertension Mother   . Diabetes Mother   . Cancer Mother   . Diabetes Father   . Hypertension Father     SOCIAL HISTORY:  Social History   Social History  . Marital Status: Single    Spouse Name: N/A  . Number of Children: 1  . 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 Smoking status: Never Smoker   . Smokeless tobacco: Never Used  . Alcohol Use: No  . Drug Use: No  . Sexual Activity: Not on file   Other Topics Concern  . Not on file   Social History Narrative   Lives at home with her daughter.   Ambidextrous   1-2 cups tea per day.    PHYSICAL EXAM   There were no vitals filed for this visit.  Not recorded      There is no weight on file to calculate  BMI.  PHYSICAL EXAMNIATION:  Gen: NAD, conversant, well nourised, obese, well groomed                     Cardiovascular: Regular rate rhythm, no peripheral edema, warm, nontender. Eyes: Conjunctivae clear without exudates or hemorrhage Neck: Supple, no carotid bruise. Pulmonary: Clear to auscultation bilaterally   NEUROLOGICAL EXAM:  MENTAL STATUS: Speech:    Speech is normal; fluent and spontaneous with normal comprehension.  Cognition:     Orientation to time, place and person     Normal recent and remote memory     Normal Attention span and concentration     Normal Language, naming, repeating,spontaneous speech     Fund of knowledge   CRANIAL NERVES: CN II: Visual fields are full to confrontation. Fundoscopic exam is normal with sharp discs and no vascular changes. Pupils are round equal and briskly reactive to light. CN III, IV, VI: extraocular movement are normal. No ptosis. CN V: Facial sensation is intact to pinprick in all 3 divisions bilaterally. Corneal responses are intact.  CN VII: Face is symmetric with normal eye closure and smile. CN VIII: Hearing is normal to rubbing fingers CN IX, X: Palate elevates symmetrically. Phonation is normal. CN XI: Head turning and shoulder shrug are intact CN XII: Tongue is midline with normal movements and no atrophy.  MOTOR:  She has slight right shoulder abduction, external rotation weakness, normal tone, muscle bulk  REFLEXES: Reflexes are 2+ and symmetric at the biceps, triceps, knees, and ankles. Plantar responses are flexor.  SENSORY: Intact to light touch, pinprick, position sense, and vibration sense are intact in fingers and toes.  COORDINATION: Rapid alternating movements and fine finger movements are intact. There is no dysmetria on finger-to-nose and heel-knee-shin.    GAIT/STANCE: Posture is normal. Gait is steady with normal steps, base, arm swing, and turning. Heel and toe walking are normal. Tandem gait is  normal.  Romberg is absent.   DIAGNOSTIC DATA (LABS, IMAGING, TESTING) - I reviewed patient records, labs, notes, testing and imaging myself where available.   ASSESSMENT AND PLAN  Claire Torres is a 60 y.o. female    Right carpal tunnel syndrome  Mild to moderate  I have suggested right wrist splint, when necessary NSAIDs, avoid repetitive right wrist flexion/extension   Neck pain  Most consistent with musculoskeletal etiology  There is no evidence of right cervical radiculopathy, or myelopathy  Neck stretching exercise  Right biceps pain  Musculoskeletal etiology  I have suggested her heating pad, when necessary NSAIDs,   Levert Feinstein, M.D. Ph.D.  [facility] [address], [address] [place], [place] [address] Ph: [phone] Fax: [phone]  CC: Francee Gentile, MD,George Osei-Bonsu, MD

## 2015-08-22 NOTE — Procedures (Signed)
   NCS (NERVE CONDUCTION STUDY) WITH EMG (ELECTROMYOGRAPHY) REPORT   STUDY DATE: August 22 2015 PATIENT NAME: Claire Torres DOB: June 06, 1956 MRN: [id number]    TECHNOLOGIST: Gearldine Shown ELECTROMYOGRAPHER: Levert Feinstein M.D.  CLINICAL INFORMATION:  60 years old right-handed female, complains of intermittent midline neck pain, radiating pain to bilateral shoulder, right biceps area pain, tenderness upon deep palpation, intermittent especially nocturnal right hand paresthesia.  On examination: Bilateral upper extremity motor sensory examination was normal, there was deep tenderness at right biceps muscles, tenderness upon deep palpation. Deep tendon reflexes were normal and symmetric.    FINDINGS: NERVE CONDUCTION STUDY: Bilateral ulnar sensory and motor responses were normal. Left median sensory and motor responses were normal.  Right median sensory response showed mildly prolonged peak latency with well preserved snap amplitude. Right median motor responses showed mildly prolonged distal latency, with normal C map amplitude, conduction velocities.   NEEDLE ELECTROMYOGRAPHY: Selective needle examinations was performed at right upper extremity muscles, right cervical paraspinal muscles.   Right abductor pollicis brevis: normal insertion activity, no spontaneous activity, normal morphology motor unit potential, with normal recruitment patterns.  Needle examination of right pronator teres, biceps, triceps, deltoid was normal.  There was no spontaneous activity at right cervical paraspinal muscles, right C5, 6, 7.  IMPRESSION:   This is mild abnormal study, there is electrodiagnostic evidence of mild to moderate right carpal tunnel syndrome. There is no evidence of right cervical radiculopathy.   INTERPRETING PHYSICIAN:   Levert Feinstein M.D. Ph.D. [facility] [address] [place], [place] [address] [phone]

## 2016-09-18 DIAGNOSIS — Z0271 Encounter for disability determination: Secondary | ICD-10-CM

## 2017-04-21 ENCOUNTER — Encounter (HOSPITAL_COMMUNITY): Payer: Self-pay

## 2017-04-21 ENCOUNTER — Other Ambulatory Visit: Payer: Self-pay

## 2017-04-21 ENCOUNTER — Emergency Department (HOSPITAL_COMMUNITY)
Admission: EM | Admit: 2017-04-21 | Discharge: 2017-04-21 | Disposition: A | Discharge status: Home / Self Care | Payer: PRIVATE HEALTH INSURANCE | Attending: Emergency Medicine | Admitting: Emergency Medicine

## 2017-04-21 DIAGNOSIS — K649 Unspecified hemorrhoids: Secondary | ICD-10-CM

## 2017-04-21 DIAGNOSIS — I10 Essential (primary) hypertension: Secondary | ICD-10-CM | POA: Diagnosis not present

## 2017-04-21 DIAGNOSIS — R197 Diarrhea, unspecified: Secondary | ICD-10-CM | POA: Diagnosis present

## 2017-04-21 DIAGNOSIS — R195 Other fecal abnormalities: Secondary | ICD-10-CM

## 2017-04-21 LAB — COMPREHENSIVE METABOLIC PANEL
ALK PHOS: 113 U/L (ref 38–126)
ALT: 16 U/L (ref 14–54)
ANION GAP: 7 (ref 5–15)
AST: 24 U/L (ref 15–41)
Albumin: 3.4 g/dL — ABNORMAL LOW (ref 3.5–5.0)
BILIRUBIN TOTAL: 0.5 mg/dL (ref 0.3–1.2)
BUN: <5 mg/dL — ABNORMAL LOW (ref 6–20)
CALCIUM: 8.9 mg/dL (ref 8.9–10.3)
CO2: 25 mmol/L (ref 22–32)
CREATININE: 0.83 mg/dL (ref 0.44–1.00)
Chloride: 107 mmol/L (ref 101–111)
Glucose, Bld: 110 mg/dL — ABNORMAL HIGH (ref 65–99)
Potassium: 3.6 mmol/L (ref 3.5–5.1)
SODIUM: 139 mmol/L (ref 135–145)
TOTAL PROTEIN: 7.2 g/dL (ref 6.5–8.1)

## 2017-04-21 LAB — CBC
HCT: 38.8 % (ref 36.0–46.0)
HEMOGLOBIN: 12.8 g/dL (ref 12.0–15.0)
MCH: 30.3 pg (ref 26.0–34.0)
MCHC: 33 g/dL (ref 30.0–36.0)
MCV: 91.9 fL (ref 78.0–100.0)
Platelets: 315 10*3/uL (ref 150–400)
RBC: 4.22 MIL/uL (ref 3.87–5.11)
RDW: 13.2 % (ref 11.5–15.5)
WBC: 4.4 10*3/uL (ref 4.0–10.5)

## 2017-04-21 MED ORDER — HYDROCORTISONE 2.5 % RE CREA: TOPICAL_CREAM | RECTAL | 0 refills | Status: DC

## 2017-04-21 NOTE — ED Triage Notes (Signed)
Patient complains of dark rectal bleeding mixed with loose stools x 1 day. States she has mild nausea and relates the bleeding to starting amoxicillin on Wednesday for sinusitis. Alert and oriented, NAD

## 2017-04-21 NOTE — ED Provider Notes (Signed)
[facility] EMERGENCY DEPARTMENT Provider Note   CSN: [id number] Arrival date & time: 04/21/17  1191     History   Chief Complaint No chief complaint on file.   HPI Claire Torres is a 61 y.o. female.  HPI   Was diagnosed with [facility] and was given amoxicillin on Wednesday Diarrhea started Friday, 3-4 times Stool was burgundy color on Friday night, but didn't see any blood on tissue which she reports was puzzling Saturday evening had brownish burgundy color Before arrival today was burgundy again, however after coming to ED was orange brown, not black or burgundy stool.   Started having rectal itching, placed some vaseline  No abdominal pain Eating ok No nausea or vomiting No fever No lightheadedness No history of bleeding No etoh Taking ibuprofen for bulging disc, stopped taking it back in August and havent taken it  Had colonoscopy at age 45    Past Medical History:  Diagnosis Date  . GERD (gastroesophageal reflux disease)    hx no meds  . Hypertension   . Neck pain     Patient Active Problem List   Diagnosis Date Noted  . Carpal tunnel syndrome 08/22/2015  . Neck pain 08/22/2015  . Right arm pain 08/22/2015    Past Surgical History:  Procedure Laterality Date  . COLONOSCOPY    . TRIGGER FINGER RELEASE Right 09/29/2012   Procedure: RELEASE A-1 PULLEY RIGHT MIDDLE FINGER ;  Surgeon: Nicki Reaper, MD;  Location: [facility];  Service: Orthopedics;  Laterality: Right;  . TUBAL LIGATION    . UPPER GI ENDOSCOPY      OB History    No data available       Home Medications    Prior to Admission medications   Medication Sig Start Date End Date Taking? Authorizing Provider  Cyanocobalamin (VITAMIN B-12 PO) Take 1,000 mg by mouth.     [provider]  gabapentin (NEURONTIN) 300 MG capsule Take 300 mg by mouth 2 (two) times daily. 11/30/14   [provider]  ibuprofen  (ADVIL,MOTRIN) 800 MG tablet Take 1 tablet (800 mg total) by mouth 3 (three) times daily. 05/14/13   Ward, Layla Maw, DO  losartan-hydrochlorothiazide (HYZAAR) 100-12.5 MG per tablet Take 1 tablet by mouth daily.    [provider]  Vitamin D, Ergocalciferol, (DRISDOL) 50000 UNITS CAPS Take 50,000 Units by mouth every 7 (seven) days. On Wednesdays    [provider]    Family History Family History  Problem Relation Age of Onset  . Hypertension Mother   . Diabetes Mother   . Cancer Mother   . Diabetes Father   . Hypertension Father     Social History Social History  Substance Use Topics  . Smoking status: Never Smoker  . Smokeless tobacco: Never Used  . Alcohol use No     Allergies   Patient has no known allergies.   Review of Systems Review of Systems  Constitutional: Negative for fever.  HENT: Negative for sore throat.   Eyes: Negative for visual disturbance.  Respiratory: Negative for cough and shortness of breath.   Cardiovascular: Negative for chest pain.  Gastrointestinal: Positive for blood in stool and diarrhea. Negative for abdominal pain, constipation, nausea and vomiting.  Genitourinary: Negative for difficulty urinating and dysuria.  Musculoskeletal: Negative for back pain and neck pain.  Skin: Negative for rash.  Neurological: Negative for syncope, light-headedness and headaches.     Physical  Exam Updated Vital Signs BP (!) 164/98   Pulse 84   Temp 98 F (36.7 C) (Oral)   Resp 16   Wt 74.8 kg (165 lb)   SpO2 98%   BMI 29.23 kg/m   Physical Exam  Constitutional: She is oriented to person, place, and time. She appears well-developed and well-nourished. No distress.  HENT:  Head: Normocephalic and atraumatic.  Eyes: Conjunctivae and EOM are normal.  Neck: Normal range of motion.  Cardiovascular: Normal rate, regular rhythm, normal heart sounds and intact distal pulses.  Exam reveals no gallop and no friction rub.   No murmur  heard. Pulmonary/Chest: Effort normal and breath sounds normal. No respiratory distress. She has no wheezes. She has no rales.  Abdominal: Soft. She exhibits no distension. There is no tenderness. There is no guarding.  Genitourinary: Rectal exam shows external hemorrhoid.  Musculoskeletal: She exhibits no edema or tenderness.  Neurological: She is alert and oriented to person, place, and time.  Skin: Skin is warm and dry. No rash noted. She is not diaphoretic. No erythema.  Nursing note and vitals reviewed.    ED Treatments / Results  Labs (all labs ordered are listed, but only abnormal results are displayed) Labs Reviewed  CBC WITH DIFFERENTIAL/PLATELET  COMPREHENSIVE METABOLIC PANEL  CBC WITH DIFFERENTIAL/PLATELET  COMPREHENSIVE METABOLIC PANEL  POC OCCULT BLOOD, ED    EKG  EKG Interpretation None       Radiology No results found.  Procedures Procedures (including critical care time)  Medications Ordered in ED Medications - No data to display   Initial Impression / Assessment and Plan / ED Course  I have reviewed the triage vital signs and the nursing notes.  Pertinent labs & imaging results that were available during my care of the patient were reviewed by me and considered in my medical decision making (see chart for details).     61 year old female with a history of GERD, hypertension presents with concern for diarrhea and burgundy colored stool.  Patient's vital signs are within normal limits, hemoglobin is normal.  She describes having a brown stool after coming to the emergency department today, had brown stool on rectal exam, and was Hemoccult negative at this time.  It is possible that patient had prior bleeding that has now improved, or that color was secondary to other ingestion.  Recommend continued hydration, and follow-up with gastroenterology. Patient discharged in stable condition with understanding of reasons to return.   Final Clinical  Impressions(s) / ED Diagnoses   Final diagnoses:  Diarrhea, unspecified type  Stool color abnormal    New Prescriptions New Prescriptions   No medications on file     Alvira MondaySchlossman, Geneieve Duell, MD 04/21/17 1048

## 2017-10-23 ENCOUNTER — Ambulatory Visit: Payer: No Typology Code available for payment source | Admitting: Family Medicine

## 2017-10-23 ENCOUNTER — Other Ambulatory Visit: Payer: Self-pay

## 2017-10-23 ENCOUNTER — Encounter: Payer: Self-pay | Admitting: Family Medicine

## 2017-10-23 VITALS — BP 142/90 | HR 80 | Temp 98.2°F | Resp 17 | Ht 63.0 in | Wt 159.6 lb

## 2017-10-23 DIAGNOSIS — M5020 Other cervical disc displacement, unspecified cervical region: Secondary | ICD-10-CM | POA: Diagnosis not present

## 2017-10-23 DIAGNOSIS — Z1329 Encounter for screening for other suspected endocrine disorder: Secondary | ICD-10-CM | POA: Diagnosis not present

## 2017-10-23 DIAGNOSIS — D508 Other iron deficiency anemias: Secondary | ICD-10-CM | POA: Diagnosis not present

## 2017-10-23 DIAGNOSIS — M542 Cervicalgia: Secondary | ICD-10-CM | POA: Diagnosis not present

## 2017-10-23 DIAGNOSIS — M5030 Other cervical disc degeneration, unspecified cervical region: Secondary | ICD-10-CM

## 2017-10-23 DIAGNOSIS — E559 Vitamin D deficiency, unspecified: Secondary | ICD-10-CM

## 2017-10-23 DIAGNOSIS — Z5181 Encounter for therapeutic drug level monitoring: Secondary | ICD-10-CM

## 2017-10-23 DIAGNOSIS — Z131 Encounter for screening for diabetes mellitus: Secondary | ICD-10-CM | POA: Diagnosis not present

## 2017-10-23 DIAGNOSIS — I10 Essential (primary) hypertension: Secondary | ICD-10-CM | POA: Diagnosis not present

## 2017-10-23 MED ORDER — GABAPENTIN 300 MG PO CAPS: 300.0000 mg | ORAL_CAPSULE | Freq: Two times a day (BID) | ORAL | 1 refills | Status: AC

## 2017-10-23 NOTE — Patient Instructions (Addendum)
Your goal is to keep your blood pressure LESS than 140/90    IF you received an x-ray today, you will receive an invoice from [facility]. Please contact [facility] at [phone] with questions or concerns regarding your invoice.   IF you received labwork today, you will receive an invoice from [employer] [facility]. Please contact [facility] at [phone] with questions or concerns regarding your invoice.   Our billing staff will not be able to assist you with questions regarding bills from these companies.  You will be contacted with the lab results as soon as they are available. The fastest way to get your results is to activate your My Chart account. Instructions are located on the last page of this paperwork. If you have not heard from [place] regarding the results in 2 weeks, please contact this office.    We recommend that you schedule a mammogram for breast cancer screening. Typically, you do not need a referral to do this. Please contact a local imaging center to schedule your mammogram.  [facility] - [phone]  *ask for the Radiology Department [facility]) - [phone] or [phone]  [facility] - [phone] [facility] - [phone] [facility] - [phone]  *ask for the Radiology Department [facility] - [phone]  *ask for the Radiology Department [facility] - [phone]  *ask for the Mammography Department [facility] - [phone] How to Take Your Blood Pressure Blood pressure is a measurement of how strongly your blood is pressing against the walls of your arteries. Arteries are blood vessels that carry blood from your heart throughout your body. Your health care provider takes your blood pressure at each office visit. You can also take your own blood pressure at home with a blood pressure machine. You may need to take your own blood  pressure:  To confirm a diagnosis of high blood pressure (hypertension).  To monitor your blood pressure over time.  To make sure your blood pressure medicine is working.  Supplies needed: To take your blood pressure, you will need a blood pressure machine. You can buy a blood pressure machine, or blood pressure monitor, at most drugstores or online. There are several types of home blood pressure monitors. When choosing one, consider the following:  Choose a monitor that has an arm cuff.  Choose a monitor that wraps snugly around your upper arm. You should be able to fit only one finger between your arm and the cuff.  Do not choose a monitor that measures your blood pressure from your wrist or finger.  Your health care provider can suggest a reliable monitor that will meet your needs. How to prepare To get the most accurate reading, avoid the following for 30 minutes before you check your blood pressure:  Drinking caffeine.  Drinking alcohol.  Eating.  Smoking.  Exercising.  Five minutes before you check your blood pressure:  Empty your bladder.  Sit quietly without talking in a dining chair, rather than in a soft couch or armchair.  How to take your blood pressure To check your blood pressure, follow the instructions in the manual that came with your blood pressure monitor. If you have a digital blood pressure monitor, the instructions may be as follows: 1. Sit up straight. 2. Place your feet on the floor. Do not cross your ankles or legs. 3. Rest your left arm at the level of your heart on a  table or desk or on the arm of a chair. 4. Pull up your shirt sleeve. 5. Wrap the blood pressure cuff around the upper part of your left arm, 1 inch (2.5 cm) above your elbow. It is best to wrap the cuff around bare skin. 6. Fit the cuff snugly around your arm. You should be able to place only one finger between the cuff and your arm. 7. Position the cord inside the groove of your  elbow. 8. Press the power button. 9. Sit quietly while the cuff inflates and deflates. 10. Read the digital reading on the monitor screen and write it down (record it). 11. Wait 2-3 minutes, then repeat the steps, starting at step 1.  What does my blood pressure reading mean? A blood pressure reading consists of a higher number over a lower number. Ideally, your blood pressure should be below 120/80. The first ("top") number is called the systolic pressure. It is a measure of the pressure in your arteries as your heart beats. The second ("bottom") number is called the diastolic pressure. It is a measure of the pressure in your arteries as the heart relaxes. Blood pressure is classified into four stages. The following are the stages for adults who do not have a short-term serious illness or a chronic condition. Systolic pressure and diastolic pressure are measured in a unit called mm Hg. Normal  Systolic pressure: below 120.  Diastolic pressure: below 80. Elevated  Systolic pressure: 120-129.  Diastolic pressure: below 80. Hypertension stage 1  Systolic pressure: 130-139.  Diastolic pressure: 80-89. Hypertension stage 2  Systolic pressure: 140 or above.  Diastolic pressure: 90 or above. You can have prehypertension or hypertension even if only the systolic or only the diastolic number in your reading is higher than normal. Follow these instructions at home:  Check your blood pressure as often as recommended by your health care provider.  Take your monitor to the next appointment with your health care provider to make sure: ? That you are using it correctly. ? That it provides accurate readings.  Be sure you understand what your goal blood pressure numbers are.  Tell your health care provider if you are having any side effects from blood pressure medicine. Contact a health care provider if:  Your blood pressure is consistently high. Get help right away if:  Your systolic  blood pressure is higher than 180.  Your diastolic blood pressure is higher than 110. This information is not intended to replace advice given to you by your health care provider. Make sure you discuss any questions you have with your health care provider. Document Released: 11/18/2015 Document Revised: 01/31/2016 Document Reviewed: 11/18/2015 Elsevier Interactive Patient Education  [employer].

## 2017-10-23 NOTE — Progress Notes (Signed)
Chief Complaint  Patient presents with  . New Patient (Initial Visit)    establish care. Dr Morrie Sheldon at the [facility] changed bp med to amlodipine 2.5 mg, concerned about bp, bp's in a.m. were 136/95 and took it this morning bp 136/85 pt feels she may need a higher dose of bp med to get numbers low.   Per pt bulging disk in neck from looking up at monitors all day when she worked at [employer] (worked there for 36 yrs).  Pt would like to restart gabapentin and vitamin d    HPI  Essential Hypertension  Pt was previously on losartan-hctz 100-12.5mg  which was discontinued due to her dizziness, hypotension She reports that her bp medication was changed to amlodipine 2.5mg   Her home bp readings are 136/85 She states that she usually gets bp readings 136/95 She reports that she checks her bp in the mornings  She takes her bp meds at 9:30pm at night  She was diagnosed with hypertension for the past 10 years She has a strong family history of hypertension She is a nonsmoker She works at [employer] in [employer] She was previously a Production designer, 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   Cervicalgia She states that she has a history of a cervical bulging disc and was treated with gabapentin She has not taken any gabapentin She would like to resume as her neck pain flares up  If she sits in a chair it aggravates it Last dose was a month ago   Overweight She is doing a wellness at work and is making dietary changes She states that she eats mostly fruits salads and vegetable salads She is working less hour compared to her previous job and is more active Wt Readings from Last 3 Encounters:  10/23/17 159 lb 9.6 oz (72.4 kg)  04/21/17 165 lb (74.8 kg)  05/02/15 152 lb (68.9 kg)   Body mass index is 28.27 kg/m.  Vitamin D def She reports that she ws perscribed 50,000 units of vitamin D monthly She reports that she is not taking vitamin D now She  denies any fatigue or bone pains  Anemia She reports that she has anemia She eats beef liver once a month and lots of dark green leafy vegetables She is not taking any supplements She denies fatigue but reports that she is cold all the time Past Medical History:  Diagnosis Date  . GERD (gastroesophageal reflux disease)    hx no meds  . Hypertension   . Neck pain     Current Outpatient Medications  Medication Sig Dispense Refill  . amLODipine (NORVASC) 2.5 MG tablet Take 2.5 mg by mouth daily.    . Cyanocobalamin (VITAMIN B-12 PO) Take 1,000 mg by mouth.     . gabapentin (NEURONTIN) 300 MG capsule Take 1 capsule (300 mg total) by mouth 2 (two) times daily. 180 capsule 1  . hydrocortisone (ANUSOL-HC) 2.5 % rectal cream Apply rectally 2 times daily (Patient not taking: Reported on 10/23/2017) 28.35 g 0  . ibuprofen (ADVIL,MOTRIN) 800 MG tablet Take 1 tablet (800 mg total) by mouth 3 (three) times daily. (Patient not taking: Reported on 10/23/2017) 21 tablet 0  . Vitamin D, Ergocalciferol, (DRISDOL) 50000 UNITS CAPS Take 50,000 Units by mouth every 7 (seven) days. On Wednesdays     No current facility-administered medications for this visit.     Allergies: No Known Allergies  Past Surgical History:  Procedure Laterality Date  .  COLONOSCOPY    . TRIGGER FINGER RELEASE Right 09/29/2012   Procedure: RELEASE A-1 PULLEY RIGHT MIDDLE FINGER ;  Surgeon: Nicki Reaper, MD;  Location: [facility];  Service: Orthopedics;  Laterality: Right;  . TUBAL LIGATION    . UPPER GI ENDOSCOPY      Social History   Socioeconomic History  . Marital status: Single    Spouse name: Not on file  . Number of children: 1  . Years of education: 18  . Highest education level: Not on file  Occupational History  . Occupation: Surveyor, mining    Comment: [employer] - 35+ years  Social Needs  . Financial resource strain: Not on file  . Food insecurity:    Worry: Not on file    Inability: Not on file    . Transportation needs:    Medical: Not on file    Non-medical: Not on file  Tobacco Use  . Smoking status: Never Smoker  . Smokeless tobacco: Never Used  Substance and Sexual Activity  . Alcohol use: No  . Drug use: No  . Sexual activity: Not on file  Lifestyle  . Physical activity:    Days per week: Not on file    Minutes per session: Not on file  . Stress: Not on file  Relationships  . Social connections:    Talks on phone: Not on file    Gets together: Not on file    Attends religious service: Not on file    Active member of club or organization: Not on file    Attends meetings of clubs or organizations: Not on file    Relationship status: Not on file  Other Topics Concern  . Not on file  Social History Narrative   Lives at home with her daughter.   Ambidextrous   1-2 cups tea per day.    Family History  Problem Relation Age of Onset  . Hypertension Mother   . Diabetes Mother   . Cancer Mother   . Diabetes Father   . Hypertension Father   . Cancer Brother   . Diabetes Brother      ROS Review of Systems See HPI Constitution: No fevers or chills No malaise No diaphoresis Skin: No rash or itching Eyes: no blurry vision, no double vision GU: no dysuria or hematuria Neuro: no dizziness or headaches all others reviewed and negative   Objective: Vitals:   10/23/17 1022 10/23/17 1118  BP: (!) 164/97 (!) 142/90  Pulse: 80   Resp: 17   Temp: 98.2 F (36.8 C)   TempSrc: Oral   SpO2: 96%   Weight: 159 lb 9.6 oz (72.4 kg)   Height:  (1.6 m)     Physical Exam  Constitutional: She is oriented to person, place, and time. She appears well-developed and well-nourished.  HENT:  Head: Normocephalic and atraumatic.  Eyes: Conjunctivae and EOM are normal.  Neck: Normal range of motion. Neck supple. No thyromegaly present.  Cardiovascular: Normal rate, regular rhythm and normal heart sounds.  No murmur heard. Pulmonary/Chest: Effort normal and breath  sounds normal. No stridor. No respiratory distress. She has no wheezes.  Abdominal: Soft. Bowel sounds are normal. She exhibits no distension and no mass. There is no tenderness. There is no guarding.  Musculoskeletal: Normal range of motion. She exhibits no edema.  Neurological: She is alert and oriented to person, place, and time. No cranial nerve deficit.  Skin: Skin is warm. Capillary refill takes less than  2 seconds. No erythema.  Psychiatric: She has a normal mood and affect. Her behavior is normal. Judgment and thought content normal.     MRI cervical spine STUDY DATE: 05/05/2015 IMPRESSION:  Mildly abnormal MRI scan of cervical spine showing mild spondylitic changes most prominent at C4-5 with asymmetric right-sided disc bulge resulting in mild foraminal narrowing without definite compression.    Assessment and Plan Claire Torres was seen today for new patient (initial visit).  Diagnoses and all orders for this visit:  Essential hypertension- bp elevated but discussed that she should continue to monitor  Stay on amlodipine 2.5mg   If elevated at next visit will increase to amlodipine  -     Lipid panel -     Comprehensive metabolic panel  Neck pain Bulging of cervical intervertebral disc-   - refilled gabapentin today -  Discussed common side effects of gabapentin  Vitamin D insufficiency- will check levels -     VITAMIN D 25 Hydroxy (Vit-D Deficiency, Fractures)  Screening for diabetes mellitus -     Hemoglobin A1c  Screening for thyroid disorder -     TSH  Other iron deficiency anemia- continue iron rich foods, will check levels  -     CBC  Encounter for medication monitoring- discussed previous losartan-hctz and the risk of dizziness and dehydration Discussed that she should continue amlodipine 2.5mg   Other orders -     gabapentin (NEURONTIN) 300 MG capsule; Take 1 capsule (300 mg total) by mouth 2 (two) times daily.    A total of 35 minutes were spent  face-to-face with the patient during this encounter and over half of that time was spent on counseling and coordination of care.  Burnis Halling A Yeira Gulden

## 2017-10-24 LAB — LIPID PANEL
CHOLESTEROL TOTAL: 161 mg/dL (ref 100–199)
Chol/HDL Ratio: 2.6 ratio (ref 0.0–4.4)
HDL: 61 mg/dL (ref >39)
LDL Calculated: 85 mg/dL (ref 0–99)
Triglycerides: 77 mg/dL (ref 0–149)
VLDL CHOLESTEROL CAL: 15 mg/dL (ref 5–40)

## 2017-10-24 LAB — CBC
HEMATOCRIT: 41.4 % (ref 34.0–46.6)
Hemoglobin: 14 g/dL (ref 11.1–15.9)
MCH: 30.8 pg (ref 26.6–33.0)
MCHC: 33.8 g/dL (ref 31.5–35.7)
MCV: 91 fL (ref 79–97)
Platelets: 293 10*3/uL (ref 150–379)
RBC: 4.54 x10E6/uL (ref 3.77–5.28)
RDW: 14 % (ref 12.3–15.4)
WBC: 3.8 10*3/uL (ref 3.4–10.8)

## 2017-10-24 LAB — COMPREHENSIVE METABOLIC PANEL
ALBUMIN: 4.2 g/dL (ref 3.6–4.8)
ALK PHOS: 118 IU/L — AB (ref 39–117)
ALT: 20 IU/L (ref 0–32)
AST: 23 IU/L (ref 0–40)
Albumin/Globulin Ratio: 1.3 (ref 1.2–2.2)
BILIRUBIN TOTAL: 0.5 mg/dL (ref 0.0–1.2)
BUN / CREAT RATIO: 11 — AB (ref 12–28)
BUN: 9 mg/dL (ref 8–27)
CO2: 24 mmol/L (ref 20–29)
CREATININE: 0.82 mg/dL (ref 0.57–1.00)
Calcium: 9.5 mg/dL (ref 8.7–10.3)
Chloride: 105 mmol/L (ref 96–106)
GFR calc non Af Amer: 77 mL/min/{1.73_m2} (ref >59)
GFR, EST AFRICAN AMERICAN: 89 mL/min/{1.73_m2} (ref >59)
GLOBULIN, TOTAL: 3.2 g/dL (ref 1.5–4.5)
GLUCOSE: 88 mg/dL (ref 65–99)
Potassium: 4.2 mmol/L (ref 3.5–5.2)
SODIUM: 143 mmol/L (ref 134–144)
TOTAL PROTEIN: 7.4 g/dL (ref 6.0–8.5)

## 2017-10-24 LAB — VITAMIN D 25 HYDROXY (VIT D DEFICIENCY, FRACTURES): VIT D 25 HYDROXY: 25.4 ng/mL — AB (ref 30.0–100.0)

## 2017-10-24 LAB — HEMOGLOBIN A1C
Est. average glucose Bld gHb Est-mCnc: 120 mg/dL
Hgb A1c MFr Bld: 5.8 % — ABNORMAL HIGH (ref 4.8–5.6)

## 2017-10-24 LAB — TSH: TSH: 1.45 u[IU]/mL (ref 0.450–4.500)

## 2017-11-20 ENCOUNTER — Telehealth: Payer: Self-pay | Admitting: Family Medicine

## 2017-11-20 NOTE — Telephone Encounter (Signed)
Copied from CRM [id number]. Topic: Quick Communication - Rx Refill/Question >> Nov 20, 2017  3:53 PM Jonette Eva wrote: Medication: amLODipine (NORVASC) 2.5 MG tablet [[id number]]   Has the patient contacted their pharmacy? Yes.   (Agent: If no, request that the patient contact the pharmacy for the refill.) (Agent: If yes, when and what did the pharmacy advise?)  Preferred Pharmacy (with phone number or street name): [facility]  Agent: Please be advised that RX refills may take up to 3 business days. We ask that you follow-up with your pharmacy.

## 2017-11-21 NOTE — Telephone Encounter (Signed)
Refill of Norvasc  Historical Provider  LOV 10/23/17   Dr. Creta Levin  [facility] [address], [place] - [address]

## 2017-11-25 NOTE — Telephone Encounter (Signed)
Advised via mychart. Needs f/u visit.

## 2017-12-05 ENCOUNTER — Encounter: Payer: Self-pay | Admitting: Family Medicine

## 2017-12-05 ENCOUNTER — Other Ambulatory Visit: Payer: Self-pay

## 2017-12-05 ENCOUNTER — Ambulatory Visit: Payer: No Typology Code available for payment source | Admitting: Family Medicine

## 2017-12-05 VITALS — BP 130/80 | HR 80 | Temp 98.4°F | Resp 17 | Ht 63.0 in | Wt 157.8 lb

## 2017-12-05 DIAGNOSIS — I10 Essential (primary) hypertension: Secondary | ICD-10-CM | POA: Diagnosis not present

## 2017-12-05 MED ORDER — AMLODIPINE BESYLATE 2.5 MG PO TABS: 2.5000 mg | ORAL_TABLET | Freq: Every day | ORAL | 3 refills | Status: AC

## 2017-12-05 NOTE — Progress Notes (Signed)
Chief Complaint  Patient presents with  . Medication Refill    amlodipine    HPI   Hypertension follow up 10/23/17 Essential Hypertension  Pt was previously on losartan-hctz 100-12.5mg  which was discontinued due to her dizziness, hypotension She reports that her bp medication was changed to amlodipine 2.5mg   Her home bp readings are 136/85 She states that she usually gets bp readings 136/95 She reports that she checks her bp in the mornings  She takes her bp meds at 9:30pm at night  She was diagnosed with hypertension for the past 10 years She has a strong family history of hypertension She is a nonsmoker She works at [employer] in [employer] She was previously a Production designer, 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 She is exercising and is adherent to low salt diet.  Blood pressure is well controlled at home with bp in the 120/90s . Cardiac symptoms none. Patient denies chest pain, chest pressure/discomfort, claudication, exertional chest pressure/discomfort, fatigue, irregular heart beat, lower extremity edema, near-syncope and orthopnea.  Cardiovascular risk factors: hypertension and obesity (BMI >= 30 kg/m2). Use of agents/ associated with hypertension: none. History of target organ damage: none.  BP Readings from Last 3 Encounters:  12/05/17 130/80  10/23/17 (!) 142/90  04/21/17 (!) 141/90   Lab Results  Component Value Date   CREATININE 0.82 10/23/2017      Past Medical History:  Diagnosis Date  . GERD (gastroesophageal reflux disease)    hx no meds  . Hypertension   . Neck pain     Current Outpatient Medications  Medication Sig Dispense Refill  . amLODipine (NORVASC) 2.5 MG tablet Take 1 tablet (2.5 mg total) by mouth daily. 90 tablet 3  . Cyanocobalamin (VITAMIN B-12 PO) Take 1,000 mg by mouth.     Marland Kitchen ibuprofen (ADVIL,MOTRIN) 800 MG tablet  Take 1 tablet (800 mg total) by mouth 3 (three) times daily. 21 tablet 0  . gabapentin (NEURONTIN) 300 MG capsule Take 1 capsule (300 mg total) by mouth 2 (two) times daily. (Patient not taking: Reported on 12/05/2017) 180 capsule 1  . hydrocortisone (ANUSOL-HC) 2.5 % rectal cream Apply rectally 2 times daily (Patient not taking: Reported on 10/23/2017) 28.35 g 0   No current facility-administered medications for this visit.     Allergies: No Known Allergies  Past Surgical History:  Procedure Laterality Date  . COLONOSCOPY    . TRIGGER FINGER RELEASE Right 09/29/2012   Procedure: RELEASE A-1 PULLEY RIGHT MIDDLE FINGER ;  Surgeon: Nicki Reaper, MD;  Location: [facility];  Service: Orthopedics;  Laterality: Right;  . TUBAL LIGATION    . UPPER GI ENDOSCOPY      Social History   Socioeconomic History  . Marital status: Single    Spouse name: Not on file  . Number of children: 1  . Years of education: 41  . Highest education level: Not on file  Occupational History  . Occupation: Surveyor, mining    Comment: [employer] - 35+ years  Social Needs  . Financial resource strain: Not on file  . Food insecurity:    Worry: Not on file    Inability: Not on file  . Transportation needs:    Medical: Not on file    Non-medical: Not on file  Tobacco Use  . Smoking status: Never Smoker  . Smokeless tobacco: Never Used  Substance and Sexual Activity  .  Alcohol use: No  . Drug use: No  . Sexual activity: Not on file  Lifestyle  . Physical activity:    Days per week: Not on file    Minutes per session: Not on file  . Stress: Not on file  Relationships  . Social connections:    Talks on phone: Not on file    Gets together: Not on file    Attends religious service: Not on file    Active member of club or organization: Not on file    Attends meetings of clubs or organizations: Not on file    Relationship status: Not on file  Other Topics Concern  . Not on file  Social History  Narrative   Lives at home with her daughter.   Ambidextrous   1-2 cups tea per day.    Family History  Problem Relation Age of Onset  . Hypertension Mother   . Diabetes Mother   . Cancer Mother   . Diabetes Father   . Hypertension Father   . Cancer Brother   . Diabetes Brother      ROS Review of Systems See HPI Constitution: No fevers or chills No malaise No diaphoresis Skin: No rash or itching Eyes: no blurry vision, no double vision GU: no dysuria or hematuria Neuro: no dizziness or headaches  all others reviewed and negative   Objective: Vitals:   12/05/17 0822 12/05/17 0846  BP: (!) 161/97 130/80  Pulse: 80   Resp: 17   Temp: 98.4 F (36.9 C)   TempSrc: Oral   SpO2: 97%   Weight: 157 lb 12.8 oz (71.6 kg)   Height: 5\' 3"  (1.6 m)     Physical Exam Physical Exam  Constitutional: She is oriented to person, place, and time. She appears well-developed and well-nourished.  HENT:  Head: Normocephalic and atraumatic.  Eyes: Conjunctivae and EOM are normal.  Cardiovascular: Normal rate, regular rhythm and normal heart sounds.   Pulmonary/Chest: Effort normal and breath sounds normal. No respiratory distress. She has no wheezes.  Abdominal: Normal appearance and bowel sounds are normal. There is no tenderness. There is no CVA tenderness.  Neurological: She is alert and oriented to person, place, and time.    Assessment and Plan Darral Dashsther was seen today for medication refill.  Diagnoses and all orders for this visit:  Essential hypertension -     amLODipine (NORVASC) 2.5 MG tablet; Take 1 tablet (2.5 mg total) by mouth daily.  -  continue current medications - DASH diet reviewed - discussed monitoring schedule for renal function - advised ways to prevent cardiovascular disease     Shaheim Mahar A Almus Woodham

## 2017-12-05 NOTE — Patient Instructions (Signed)
     IF you received an x-ray today, you will receive an invoice from [facility]. Please contact [facility] at [phone] with questions or concerns regarding your invoice.   IF you received labwork today, you will receive an invoice from [facility]. Please contact [facility] at [phone] with questions or concerns regarding your invoice.   Our billing staff will not be able to assist you with questions regarding bills from these companies.  You will be contacted with the lab results as soon as they are available. The fastest way to get your results is to activate your My Chart account. Instructions are located on the last page of this paperwork. If you have not heard from us regarding the results in 2 weeks, please contact this office.     

## 2018-01-23 ENCOUNTER — Other Ambulatory Visit: Payer: Self-pay

## 2018-01-23 ENCOUNTER — Encounter: Payer: Self-pay | Admitting: Family Medicine

## 2018-01-23 ENCOUNTER — Ambulatory Visit (INDEPENDENT_AMBULATORY_CARE_PROVIDER_SITE_OTHER): Payer: No Typology Code available for payment source | Admitting: Family Medicine

## 2018-01-23 ENCOUNTER — Other Ambulatory Visit: Payer: Self-pay | Admitting: Family Medicine

## 2018-01-23 VITALS — BP 128/84 | HR 83 | Temp 98.0°F | Resp 17 | Ht 63.0 in | Wt 160.6 lb

## 2018-01-23 DIAGNOSIS — Z1239 Encounter for other screening for malignant neoplasm of breast: Secondary | ICD-10-CM

## 2018-01-23 DIAGNOSIS — Z1231 Encounter for screening mammogram for malignant neoplasm of breast: Secondary | ICD-10-CM

## 2018-01-23 DIAGNOSIS — Z1159 Encounter for screening for other viral diseases: Secondary | ICD-10-CM

## 2018-01-23 DIAGNOSIS — R87612 Low grade squamous intraepithelial lesion on cytologic smear of cervix (LGSIL): Secondary | ICD-10-CM

## 2018-01-23 DIAGNOSIS — Z1211 Encounter for screening for malignant neoplasm of colon: Secondary | ICD-10-CM | POA: Diagnosis not present

## 2018-01-23 DIAGNOSIS — Z124 Encounter for screening for malignant neoplasm of cervix: Secondary | ICD-10-CM | POA: Diagnosis not present

## 2018-01-23 DIAGNOSIS — Z0000 Encounter for general adult medical examination without abnormal findings: Secondary | ICD-10-CM | POA: Diagnosis not present

## 2018-01-23 DIAGNOSIS — Z114 Encounter for screening for human immunodeficiency virus [HIV]: Secondary | ICD-10-CM

## 2018-01-23 NOTE — Patient Instructions (Addendum)
   IF you received an x-ray today, you will receive an invoice from [facility]. Please contact  Radiology at [phone] with questions or concerns regarding your invoice.   IF you received labwork today, you will receive an invoice from [facility]. Please contact [facility] at [phone] with questions or concerns regarding your invoice.   Our billing staff will not be able to assist you with questions regarding bills from these companies.  You will be contacted with the lab results as soon as they are available. The fastest way to get your results is to activate your My Chart account. Instructions are located on the last page of this paperwork. If you have not heard from us regarding the results in 2 weeks, please contact this office.     Cancer Screening for Women A cancer screening is a test or exam that checks for cancer. Your health care provider will recommend specific cancer screenings based on your age, personal history, and family history of cancer. Work with your health care provider to create a cancer screening schedule that protects your health. Why is cancer screening done? Cancer screening is done to look for cancer in the very early stages, before it spreads and becomes harder to treat and before you would start to notice symptoms. Finding cancer early improves the chances of successful treatment. It may save your life. Who should be screened for cancer? All women should be screened for certain cancers, including breast cancer, cervical cancer, and skin cancer. Your health care provider may recommend screenings for other types of cancer if:  You had cancer before.  You have a family member with cancer.  You have abnormal genes that could increase the risk of cancer.  You have risk factors for certain cancers, such as smoking.  When you should be screened for cancer depends on:  Your age.  Your medical history and your family's medical  history.  Certain lifestyle factors, such as smoking.  Environmental exposure, such as to asbestos.  What are some common cancer screenings? Breast cancer Breast cancer screening is done with a test that takes images of breast tissue (mammogram). Here are some screening guidelines:  When you are age 40-44. you will be given the choice to start having mammograms.  When you are age 45-54, you should have a mammogram every year.  You may start having mammograms before age 45 if you have risk factors for breast cancer, such as having an immediate family member with breast cancer.  When you are age 55 or older, you should have a mammogram every 1-2 years for as long as you are in good health and have a life expectancy of 10 years or more.  It is important to know what your breasts look and feel like so you can report any changes to your health care provider.  Cervical cancer Cervical cancer screening is done with a Pap test. This testchecks for abnormalities, including the virus that causes cervical cancer (human papillomavirus, or HPV). To perform the test, a health care provider takes a swab of cervical cells during a pelvic exam. Screening for cervical cancer with a Pap test should start at age 21. Here are some screening guidelines:  When you are age 21-29, you should have a Pap test every 3 years.  When you are age 30-65, you should have a Pap test and HPV test every 5 years or have a Pap test every 3 years.  You may be screened for cervical cancer more   often if you have risk factors for cervical cancer.  If your Pap tests are abnormal, you may have an HPV test.  If you have had the HPV vaccine, you will still be screened for cervical cancer and follow normal screening recommendations.  You do not need to be screened for cervical cancer if any of the following apply to you:  You are older than age 61 and you have not had a serious cervical precancer or cancer in the last 20  years.  Your cervix and uterus have been removed and you have never had cervical cancer or precancerous cells.  Endometrial cancer There is no standard screening test for endometrial cancer, but the cancer can be detected with:  A test of a sample of tissue taken from the lining of the uterus (endometrial tissue biopsy).  A vaginal ultrasound.  Pap tests.  If you are at increased risk for endometrial cancer, you may need to have these tests more often than normal. You are at increased risk if:  You have a family history of ovarian, uterine, or colon cancer.  You are taking tamoxifen, a drug that is used to treat breast cancer.  You have certain types of colon cancer.  If you have reached menopause, it is especially important to talk with your health care provider about any vaginal bleeding or spotting. Screening for endometrial cancer is not recommended for women who do not have symptoms of the cancer, such as vaginal bleeding. Colorectal cancer Screening for colorectal cancer is recommended starting at age 73 for most women. If you have a family history of colon or rectal cancer or other risk factors, you may need to start having screenings earlier. Talk with your health care provider about which screening test is right for you and how often you should be screened. Colorectal cancer screening looks for cancer or for growths called polyps that often form before cancer starts. Tests to look for cancer or polyps include:  Colonoscopy or flexible sigmoidoscopy. For these procedures, a flexible tube with a small camera is inserted into the rectum.  CT colonography. This test uses X-rays and a contrast dye to check the colon for polyps. If a polyp is found, you may need to have a colonoscopy so the polyp can be located and removed.  Tests to look for cancer in the stool (feces) include:  Guaiac-based fecal occult blood test (FOBT). This test detects blood in stool. It can be done at home  with a kit.  Fecal immunochemical test (FIT). This test detects blood in stool. For this test, you will need to collect stool samples at home.  Stool DNA test. This test looks for blood in stool and any changes in DNA that can lead to colon cancer. For this test, you will need to collect a stool sample at home and send it to a lab.  Skin cancer Skin cancer screening is done by checking the skin for unusual moles or spots and any changes in existing moles. Your health care provider should check your skin for signs of skin cancer at every physical exam. You should check your skin every month and tell your health care provider right away if anything looks unusual. Women with a higher-than-normal risk for skin cancer may want to see a skin specialist (dermatologist) for an annual body check. Lung cancer Lung cancer screening is done with a CT scan that looks for abnormal cells in the lungs. Discuss lung cancer screening with your health care provider  if you are 31-29 years old and if any of the following apply to you:  You currently smoke.  You used to smoke heavily.  You have had at least a 30-pack-year smoking history.  You have quit smoking within the past 15 years.  If you smoke heavily or if you used to smoke, you may need to be screened every year. Where to find more information:  [place]: SkinPromotion.no  Centers for Disease Control and Prevention: http://knight-sullivan.biz/  Department of Health and Human Services: BankingDetective.si Contact a health care provider if:  You have concerns about any signs or symptoms of cancer, such as: ? Moles that have an unusual shape or color. ? Changes in existing moles. ? A sore on your skin that does not heal. ? Blood in your stool. ? Fatigue that does not go away. ? Frequent pain or cramping in your  abdomen. ? Coughing, or coughing up blood. ? Losing weight without trying. ? Lumps or other changes in your breasts. ? Vaginal bleeding, spotting, or changes in your periods. Summary  Be aware of and watch for signs and symptoms of cancer, especially symptoms of breast cancer, cervical cancer, endometrial cancer, colorectal cancer, skin cancer, and lung cancer.  Early detection of cancer with cancer screening may save your life.  Talk with your health care provider about your specific cancer risks.  Work together with your health care provider to create a cancer screening plan that is right for you. This information is not intended to replace advice given to you by your health care provider. Make sure you discuss any questions you have with your health care provider. Document Released: 03/08/2016 Document Revised: 03/08/2016 Document Reviewed: 03/08/2016 Elsevier Interactive Patient Education  [employer].

## 2018-01-23 NOTE — Progress Notes (Signed)
Chief Complaint  Patient presents with  . Annual Exam    cpe with pap-3 month f/u    Subjective:  Claire Torres is a 62 y.o. female here for a health maintenance visit.  Patient is established pt  Patient Active Problem List   Diagnosis Date Noted  . Carpal tunnel syndrome 08/22/2015  . Neck pain 08/22/2015  . Right arm pain 08/22/2015    Past Medical History:  Diagnosis Date  . GERD (gastroesophageal reflux disease)    hx no meds  . Hypertension   . Neck pain     Past Surgical History:  Procedure Laterality Date  . COLONOSCOPY    . TRIGGER FINGER RELEASE Right 09/29/2012   Procedure: RELEASE A-1 PULLEY RIGHT MIDDLE FINGER ;  Surgeon: Wynonia Sours, MD;  Location: [facility];  Service: Orthopedics;  Laterality: Right;  . TUBAL LIGATION    . UPPER GI ENDOSCOPY       Outpatient Medications Prior to Visit  Medication Sig Dispense Refill  . amLODipine (NORVASC) 2.5 MG tablet Take 1 tablet (2.5 mg total) by mouth daily. 90 tablet 3  . Cyanocobalamin (VITAMIN B-12 PO) Take 1,000 mg by mouth.     . gabapentin (NEURONTIN) 300 MG capsule Take 1 capsule (300 mg total) by mouth 2 (two) times daily. 180 capsule 1  . ibuprofen (ADVIL,MOTRIN) 800 MG tablet Take 1 tablet (800 mg total) by mouth 3 (three) times daily. 21 tablet 0  . hydrocortisone (ANUSOL-HC) 2.5 % rectal cream Apply rectally 2 times daily (Patient not taking: Reported on 01/23/2018) 28.35 g 0   No facility-administered medications prior to visit.     No Known Allergies   Family History  Problem Relation Age of Onset  . Hypertension Mother   . Diabetes Mother   . Cancer Mother   . Diabetes Father   . Hypertension Father   . Cancer Brother   . Diabetes Brother   . Breast cancer Maternal Aunt      Health Habits: Dental Exam: up to date Eye Exam: up to date   Social History   Socioeconomic History  . Marital status: Single    Spouse name: Not on file  . Number of children: 1  . Years  of education: 44  . Highest education level: Not on file  Occupational History  . 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 Financial resource strain: Not on file  . Food insecurity:    Worry: Not on file    Inability: Not on file  . Transportation needs:    Medical: Not on file    Non-medical: Not on file  Tobacco Use  . Smoking status: Never Smoker  . Smokeless tobacco: Never Used  Substance and Sexual Activity  . Alcohol use: No  . Drug use: No  . Sexual activity: Not on file  Lifestyle  . Physical activity:    Days per week: Not on file    Minutes per session: Not on file  . Stress: Not on file  Relationships  . Social connections:    Talks on phone: Not on file    Gets together: Not on file    Attends religious service: Not on file    Active member of club or organization: Not on file    Attends meetings of clubs or organizations: Not on file    Relationship status: Not on file  . Intimate partner violence:  Fear of current or ex partner: Not on file    Emotionally abused: Not on file    Physically abused: Not on file    Forced sexual activity: Not on file  Other Topics Concern  . Not on file  Social History Narrative   Lives at home with her daughter.   Ambidextrous   1-2 cups tea per day.   Social History   Substance and Sexual Activity  Alcohol Use No   Social History   Tobacco Use  Smoking Status Never Smoker  Smokeless Tobacco Never Used   Social History   Substance and Sexual Activity  Drug Use No    GYN: Sexual Health Menstrual status: regular menses LMP: No LMP recorded. Patient is postmenopausal. Last pap smear: see HM section History of abnormal pap smears:   Health Maintenance: See under health Maintenance activity for review of completion dates as well. Immunization History  Administered Date(s) Administered  . Tdap 10/01/2016      Depression Screen-PHQ2/9 Depression screen [facility] 2/9  01/23/2018 12/05/2017 10/23/2017  Decreased Interest 0 0 0  Down, Depressed, Hopeless 0 0 0  PHQ - 2 Score 0 0 0       Depression Severity and Treatment Recommendations:  0-4= None  5-9= Mild / Treatment: Support, educate to call if worse; return in one month  10-14= Moderate / Treatment: Support, watchful waiting; Antidepressant or Psycotherapy  15-19= Moderately severe / Treatment: Antidepressant OR Psychotherapy  >= 20 = Major depression, severe / Antidepressant AND Psychotherapy    Review of Systems   Review of Systems  Constitutional: Negative for chills and fever.  Respiratory: Negative for cough and shortness of breath.   Cardiovascular: Negative for chest pain and palpitations.  Gastrointestinal: Negative for abdominal pain, constipation, nausea and vomiting.  Musculoskeletal: Negative for back pain and joint pain.  Skin: Negative for itching and rash.  Neurological: Negative for dizziness and headaches.  Psychiatric/Behavioral: Negative for depression. The patient is not nervous/anxious.     See HPI for ROS as well.    Objective:   Vitals:   01/23/18 0834  BP: 128/84  Pulse: 83  Resp: 17  Temp: 98 F (36.7 C)  TempSrc: Oral  SpO2: 94%  Weight: 160 lb 9.6 oz (72.8 kg)  Height: 5' 3"  (1.6 m)    Body mass index is 28.45 kg/m.  Physical Exam  Chaperone present BP 128/84 (BP Location: Left Arm, Patient Position: Sitting, Cuff Size: Normal)   Pulse 83   Temp 98 F (36.7 C) (Oral)   Resp 17   Ht 5' 3"  (1.6 m)   Wt 160 lb 9.6 oz (72.8 kg)   SpO2 94%   BMI 28.45 kg/m   General Appearance:    Alert, cooperative, no distress, appears stated age  Head:    Normocephalic, without obvious abnormality, atraumatic  Eyes:    PERRL, conjunctiva/corneas clear, EOM's intact, fundi    benign, both eyes  Ears:    Normal TM's and external ear canals, both ears  Nose:   Nares normal, septum midline, mucosa normal, no drainage    or sinus tenderness  Throat:   Lips,  mucosa, and tongue normal; teeth and gums normal  Neck:   Supple, symmetrical, trachea midline, no adenopathy;    thyroid:  no enlargement/tenderness/nodules; no carotid   bruit or JVD  Back:     Symmetric, no curvature, ROM normal, no CVA tenderness  Lungs:     Clear to auscultation bilaterally, respirations unlabored  Chest Wall:    No tenderness or deformity   Heart:    Regular rate and rhythm, S1 and S2 normal, no murmur, rub   or gallop  Breast Exam:    No tenderness, masses, or nipple abnormality  Abdomen:     Soft, non-tender, bowel sounds active all four quadrants,    no masses, no organomegaly  Genitalia:    Normal female without lesion, discharge or tenderness, no cmt, no ovarian or uterine masses Pap smear performed  Extremities:   Extremities normal, atraumatic, no cyanosis or edema  Pulses:   2+ and symmetric all extremities  Skin:   Skin color, texture, turgor normal, no rashes or lesions  Lymph nodes:   Cervical, supraclavicular, inguinal and axillary nodes normal  Neurologic:   CNII-XII intact, normal strength, sensation and reflexes    throughout      Assessment/Plan:   Patient was seen for a health maintenance exam.  Counseled the patient on health maintenance issues. Reviewed her health mainteance schedule and ordered appropriate tests (see orders.) Counseled on regular exercise and weight management. Recommend regular eye exams and dental cleaning.   The following issues were addressed today for health maintenance:   Claire Torres was seen today for annual exam.  Diagnoses and all orders for this visit:  Encounter for health maintenance examination- Women's Health Maintenance Plan Advised monthly breast exam and annual mammogram Advised dental exam every six months Discussed stress management Discussed pap smear screening guidelines  Screening for breast cancer -     Cancel: MM Digital Screening; Future  Special screening for malignant neoplasms, colon -      Ambulatory referral to Gastroenterology  Screening for cervical cancer -     Pap IG, CT/NG NAA, and HPV (high risk) [facility]  Need for hepatitis C screening test -     HCV Ab w/Rflx to Verification  Encounter for screening for HIV -     HIV antibody  Low grade squamous intraepithelial lesion (LGSIL) on cervical Pap smear -     Ambulatory referral to Gynecology  Other orders -     Interpretation:    No follow-ups on file.    Body mass index is 28.45 kg/m.:  Discussed the patient's BMI with patient. The BMI body mass index is 28.45 kg/m.     Future Appointments  Date Time Provider [place]  04/02/2018 12:00 PM Princess Bruins, MD [facility]  07/28/2018  3:40 PM Forrest Moron, MD PCP-PCP [facility]    Patient Instructions       IF you received an x-ray today, you will receive an invoice from [facility]. Please contact [facility] at [phone] with questions or concerns regarding your invoice.   IF you received labwork today, you will receive an invoice from [facility]. Please contact [facility] at [phone] with questions or concerns regarding your invoice.   Our billing staff will not be able to assist you with questions regarding bills from these companies.  You will be contacted with the lab results as soon as they are available. The fastest way to get your results is to activate your My Chart account. Instructions are located on the last page of this paperwork. If you have not heard from [place] regarding the results in 2 weeks, please contact this office.     Cancer Screening for Women A cancer screening is a test or exam that checks for cancer. Your health care provider will recommend specific cancer screenings based on your age, personal  history, and family history of cancer. Work with your health care provider to create a cancer screening schedule that protects your health. Why is cancer screening done? Cancer screening is done to  look for cancer in the very early stages, before it spreads and becomes harder to treat and before you would start to notice symptoms. Finding cancer early improves the chances of successful treatment. It may save your life. Who should be screened for cancer? All women should be screened for certain cancers, including breast cancer, cervical cancer, and skin cancer. Your health care provider may recommend screenings for other types of cancer if:  You had cancer before.  You have a family member with cancer.  You have abnormal genes that could increase the risk of cancer.  You have risk factors for certain cancers, such as smoking.  When you should be screened for cancer depends on:  Your age.  Your medical history and your family's medical history.  Certain lifestyle factors, such as smoking.  Environmental exposure, such as to asbestos.  What are some common cancer screenings? Breast cancer Breast cancer screening is done with a test that takes images of breast tissue (mammogram). Here are some screening guidelines:  When you are age 15-44. you will be given the choice to start having mammograms.  When you are age 49-54, you should have a mammogram every year.  You may start having mammograms before age 77 if you have risk factors for breast cancer, such as having an immediate family member with breast cancer.  When you are age 46 or older, you should have a mammogram every 1-2 years for as long as you are in good health and have a life expectancy of 10 years or more.  It is important to know what your breasts look and feel like so you can report any changes to your health care provider.  Cervical cancer Cervical cancer screening is done with a Pap test. This testchecks for abnormalities, including the virus that causes cervical cancer (human papillomavirus, or HPV). To perform the test, a health care provider takes a swab of cervical cells during a pelvic exam. Screening for  cervical cancer with a Pap test should start at age 67. Here are some screening guidelines:  When you are age 89-29, you should have a Pap test every 3 years.  When you are age 10-65, you should have a Pap test and HPV test every 5 years or have a Pap test every 3 years.  You may be screened for cervical cancer more often if you have risk factors for cervical cancer.  If your Pap tests are abnormal, you may have an HPV test.  If you have had the HPV vaccine, you will still be screened for cervical cancer and follow normal screening recommendations.  You do not need to be screened for cervical cancer if any of the following apply to you:  You are older than age 30 and you have not had a serious cervical precancer or cancer in the last 20 years.  Your cervix and uterus have been removed and you have never had cervical cancer or precancerous cells.  Endometrial cancer There is no standard screening test for endometrial cancer, but the cancer can be detected with:  A test of a sample of tissue taken from the lining of the uterus (endometrial tissue biopsy).  A vaginal ultrasound.  Pap tests.  If you are at increased risk for endometrial cancer, you may need to have these tests  more often than normal. You are at increased risk if:  You have a family history of ovarian, uterine, or colon cancer.  You are taking tamoxifen, a drug that is used to treat breast cancer.  You have certain types of colon cancer.  If you have reached menopause, it is especially important to talk with your health care provider about any vaginal bleeding or spotting. Screening for endometrial cancer is not recommended for women who do not have symptoms of the cancer, such as vaginal bleeding. Colorectal cancer Screening for colorectal cancer is recommended starting at age 29 for most women. If you have a family history of colon or rectal cancer or other risk factors, you may need to start having screenings  earlier. Talk with your health care provider about which screening test is right for you and how often you should be screened. Colorectal cancer screening looks for cancer or for growths called polyps that often form before cancer starts. Tests to look for cancer or polyps include:  Colonoscopy or flexible sigmoidoscopy. For these procedures, a flexible tube with a small camera is inserted into the rectum.  CT colonography. This test uses X-rays and a contrast dye to check the colon for polyps. If a polyp is found, you may need to have a colonoscopy so the polyp can be located and removed.  Tests to look for cancer in the stool (feces) include:  Guaiac-based fecal occult blood test (FOBT). This test detects blood in stool. It can be done at home with a kit.  Fecal immunochemical test (FIT). This test detects blood in stool. For this test, you will need to collect stool samples at home.  Stool DNA test. This test looks for blood in stool and any changes in DNA that can lead to colon cancer. For this test, you will need to collect a stool sample at home and send it to a lab.  Skin cancer Skin cancer screening is done by checking the skin for unusual moles or spots and any changes in existing moles. Your health care provider should check your skin for signs of skin cancer at every physical exam. You should check your skin every month and tell your health care provider right away if anything looks unusual. Women with a higher-than-normal risk for skin cancer may want to see a skin specialist (dermatologist) for an annual body check. Lung cancer Lung cancer screening is done with a CT scan that looks for abnormal cells in the lungs. Discuss lung cancer screening with your health care provider if you are 45-31 years old and if any of the following apply to you:  You currently smoke.  You used to smoke heavily.  You have had at least a 30-pack-year smoking history.  You have quit smoking within  the past 15 years.  If you smoke heavily or if you used to smoke, you may need to be screened every year. Where to find more information:  [place]: SkinPromotion.no  Centers for Disease Control and Prevention: http://knight-sullivan.biz/  Department of Health and Human Services: BankingDetective.si Contact a health care provider if:  You have concerns about any signs or symptoms of cancer, such as: ? Moles that have an unusual shape or color. ? Changes in existing moles. ? A sore on your skin that does not heal. ? Blood in your stool. ? Fatigue that does not go away. ? Frequent pain or cramping in your abdomen. ? Coughing, or coughing up blood. ? Losing weight without trying. ?  Lumps or other changes in your breasts. ? Vaginal bleeding, spotting, or changes in your periods. Summary  Be aware of and watch for signs and symptoms of cancer, especially symptoms of breast cancer, cervical cancer, endometrial cancer, colorectal cancer, skin cancer, and lung cancer.  Early detection of cancer with cancer screening may save your life.  Talk with your health care provider about your specific cancer risks.  Work together with your health care provider to create a cancer screening plan that is right for you. This information is not intended to replace advice given to you by your health care provider. Make sure you discuss any questions you have with your health care provider. Document Released: 03/08/2016 Document Revised: 03/08/2016 Document Reviewed: 03/08/2016 Elsevier Interactive Patient Education  [employer].

## 2018-01-24 LAB — HIV ANTIBODY (ROUTINE TESTING W REFLEX): HIV SCREEN 4TH GENERATION: NONREACTIVE

## 2018-01-24 LAB — HCV INTERPRETATION

## 2018-01-24 LAB — HCV AB W/RFLX TO VERIFICATION

## 2018-01-29 LAB — PAP IG, CT-NG NAA, HPV HIGH-RISK
Chlamydia, Nuc. Acid Amp: NEGATIVE
Gonococcus by Nucleic Acid Amp: NEGATIVE
HPV, HIGH-RISK: POSITIVE — AB
PAP SMEAR COMMENT: 0

## 2018-01-30 ENCOUNTER — Encounter: Payer: Self-pay | Admitting: Family Medicine

## 2018-02-17 ENCOUNTER — Ambulatory Visit: Payer: PRIVATE HEALTH INSURANCE

## 2018-02-19 ENCOUNTER — Ambulatory Visit
Admission: RE | Admit: 2018-02-19 | Discharge: 2018-02-19 | Disposition: A | Discharge status: Home / Self Care | Payer: PRIVATE HEALTH INSURANCE | Source: Ambulatory Visit | Attending: Family Medicine | Admitting: Family Medicine

## 2018-02-19 DIAGNOSIS — Z1231 Encounter for screening mammogram for malignant neoplasm of breast: Secondary | ICD-10-CM

## 2018-04-01 ENCOUNTER — Encounter: Payer: Self-pay | Admitting: Family Medicine

## 2018-04-02 ENCOUNTER — Encounter: Payer: Self-pay | Admitting: Obstetrics & Gynecology

## 2018-04-02 ENCOUNTER — Ambulatory Visit: Payer: No Typology Code available for payment source | Admitting: Obstetrics & Gynecology

## 2018-04-02 VITALS — BP 134/90 | Ht 64.5 in | Wt 164.0 lb

## 2018-04-02 DIAGNOSIS — B977 Papillomavirus as the cause of diseases classified elsewhere: Secondary | ICD-10-CM

## 2018-04-02 DIAGNOSIS — N870 Mild cervical dysplasia: Secondary | ICD-10-CM

## 2018-04-02 DIAGNOSIS — N841 Polyp of cervix uteri: Secondary | ICD-10-CM | POA: Diagnosis not present

## 2018-04-02 DIAGNOSIS — R87612 Low grade squamous intraepithelial lesion on cytologic smear of cervix (LGSIL): Secondary | ICD-10-CM | POA: Diagnosis not present

## 2018-04-02 DIAGNOSIS — N72 Inflammatory disease of cervix uteri: Secondary | ICD-10-CM

## 2018-04-02 NOTE — Progress Notes (Signed)
    MARIGOLD MOM 1956/06/06 [id number]        62 y.o.  G1P1L1 Single  RP: LGSIL/HPV HR positive on Pap 01/23/2018 for Colposcopy  HPI: Pap 01/2018 LGSIL/HPV HR pos.  Gono/Chlam both negative, Hep C NR, HIV NR 01/2018.  Previously normal pap.  No pelvic pain.     OB History  Gravida Para Term Preterm AB Living  1 1       1   SAB TAB Ectopic Multiple Live Births               # Outcome Date GA Lbr Len/2nd Weight Sex Delivery Anes PTL Lv  1 Para             Past medical history,surgical history, problem list, medications, allergies, family history and social history were all reviewed and documented in the EPIC chart.   Directed ROS with pertinent positives and negatives documented in the history of present illness/assessment and plan.  Exam:  Vitals:   04/02/18 1202  BP: 134/90  Weight: 164 lb (74.4 kg)  Height: 5' 4.5" (1.638 m)   General appearance:  Normal  Colposcopy Procedure Note ROGINA SCHIANO 04/02/2018  Indications: LGSIL/HPV HR positive  Procedure Details  The risks and benefits of the procedure and Verbal informed consent obtained.  Speculum placed in vagina and excellent visualization of cervix achieved, cervix swabbed x 3 with acetic acid solution.  Findings:  Cervix colposcopy: Physical Exam  Genitourinary:      Vaginal colposcopy: Normal  Vulvar colposcopy: Grossly normal  Perirectal colposcopy: Grossly normal  The cervix was sprayed with Hurricane before performing the cervical biopsies.  Specimens:  HPV 16-18-45.  Cervical Bx at 3 O'clock.  Endocervical Polyp.  Complications: None, Silver Nitrate on Cervix . Plan:  Management per results.    Assessment/Plan:  62 y.o. G1P1   1. 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  Counseling on colposcopy done and on the significance and management of abnormal Pap test and HPV.  Colposcopy findings reviewed with patient.  Will manage per cervical biopsy and HPV 16-18-45  results. - Pathology Report  2. High risk human papilloma virus (HPV) infection of cervix HPV 16-18 and 45 done today. - HPV Type 16 and 18/45 RNA  3. Cervical polyp Excision of cervical polyp without difficulty.  Specimen sent to pathology. - Pathology Report  Counseling on above issues and coordination of care more than 50% for 20 minutes.  Genia Del MD, 12:11 PM 04/02/2018

## 2018-04-03 LAB — HPV TYPE 16 AND 18/45 RNA
HPV TYPE 16 RNA: NOT DETECTED
HPV Type 18/45 RNA: NOT DETECTED

## 2018-04-04 ENCOUNTER — Encounter: Payer: Self-pay | Admitting: Obstetrics & Gynecology

## 2018-04-04 LAB — PATHOLOGY

## 2018-04-04 LAB — TISSUE PATH REPORT

## 2018-04-04 NOTE — Patient Instructions (Signed)
1. 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  Counseling on colposcopy done and on the significance and management of abnormal Pap test and HPV.  Colposcopy findings reviewed with patient.  Will manage per cervical biopsy and HPV 16-18-45 results. - Pathology Report  2. High risk human papilloma virus (HPV) infection of cervix HPV 16-18 and 45 done today. - HPV Type 16 and 18/45 RNA  3. Cervical polyp Excision of cervical polyp without difficulty.  Specimen sent to pathology. - Pathology Report  Claire Torres, it was a pleasure meeting you today!  I will inform you of your results as soon as they are available.

## 2018-05-28 ENCOUNTER — Telehealth: Payer: Self-pay | Admitting: Family Medicine

## 2018-05-28 NOTE — Telephone Encounter (Signed)
Called and spoke with pt regarding their appt with Dr. Creta LevinStallings on 07/28/18. [facility] pt will need to be rescheduled for a 6 month F/U with Stallings. Pt will look at her schedule for work tomorrow and call us back. Please make appt when pt calls. Thank you!

## 2018-07-28 ENCOUNTER — Ambulatory Visit: Payer: No Typology Code available for payment source | Admitting: Family Medicine

## 2018-08-12 ENCOUNTER — Encounter: Payer: Self-pay | Admitting: Family Medicine

## 2018-08-12 ENCOUNTER — Encounter: Payer: Self-pay | Admitting: Obstetrics & Gynecology

## 2018-10-07 ENCOUNTER — Encounter: Payer: Self-pay | Admitting: *Deleted

## 2018-10-07 NOTE — Progress Notes (Signed)
Received letter from insurance requesting information of patient from 03/30/17-03/29/18.  We do not have any information as this patient saw [place] as a new patient referral on 04/02/18.  Referring MD information provided. KW CMA

## 2019-04-02 ENCOUNTER — Other Ambulatory Visit: Payer: Self-pay

## 2019-04-02 DIAGNOSIS — Z20822 Contact with and (suspected) exposure to covid-19: Secondary | ICD-10-CM

## 2019-04-04 LAB — NOVEL CORONAVIRUS, NAA: SARS-CoV-2, NAA: NOT DETECTED

## 2019-11-23 ENCOUNTER — Emergency Department (HOSPITAL_COMMUNITY)
Admission: EM | Admit: 2019-11-23 | Discharge: 2019-11-23 | Disposition: A | Discharge status: Home / Self Care | Payer: 59 | Attending: Emergency Medicine | Admitting: Emergency Medicine

## 2019-11-23 ENCOUNTER — Emergency Department (HOSPITAL_COMMUNITY): Payer: 59

## 2019-11-23 ENCOUNTER — Encounter (HOSPITAL_COMMUNITY): Payer: Self-pay | Admitting: Emergency Medicine

## 2019-11-23 ENCOUNTER — Other Ambulatory Visit: Payer: Self-pay

## 2019-11-23 DIAGNOSIS — R112 Nausea with vomiting, unspecified: Secondary | ICD-10-CM | POA: Diagnosis not present

## 2019-11-23 DIAGNOSIS — Z20822 Contact with and (suspected) exposure to covid-19: Secondary | ICD-10-CM | POA: Diagnosis not present

## 2019-11-23 DIAGNOSIS — R531 Weakness: Secondary | ICD-10-CM | POA: Diagnosis present

## 2019-11-23 DIAGNOSIS — I10 Essential (primary) hypertension: Secondary | ICD-10-CM | POA: Insufficient documentation

## 2019-11-23 DIAGNOSIS — Z79899 Other long term (current) drug therapy: Secondary | ICD-10-CM | POA: Insufficient documentation

## 2019-11-23 LAB — BASIC METABOLIC PANEL
Anion gap: 12 (ref 5–15)
BUN: 10 mg/dL (ref 8–23)
CO2: 25 mmol/L (ref 22–32)
Calcium: 8.9 mg/dL (ref 8.9–10.3)
Chloride: 104 mmol/L (ref 98–111)
Creatinine, Ser: 0.97 mg/dL (ref 0.44–1.00)
GFR calc Af Amer: >60 mL/min (ref >60)
GFR calc non Af Amer: >60 mL/min (ref >60)
Glucose, Bld: 129 mg/dL — ABNORMAL HIGH (ref 70–99)
Potassium: 4.9 mmol/L (ref 3.5–5.1)
Sodium: 141 mmol/L (ref 135–145)

## 2019-11-23 LAB — SARS CORONAVIRUS 2 BY RT PCR (HOSPITAL ORDER, PERFORMED IN ~~LOC~~ HOSPITAL LAB): SARS Coronavirus 2: NEGATIVE

## 2019-11-23 LAB — CBC
HCT: 41.7 % (ref 36.0–46.0)
Hemoglobin: 13.8 g/dL (ref 12.0–15.0)
MCH: 31.4 pg (ref 26.0–34.0)
MCHC: 33.1 g/dL (ref 30.0–36.0)
MCV: 94.8 fL (ref 80.0–100.0)
Platelets: 244 10*3/uL (ref 150–400)
RBC: 4.4 MIL/uL (ref 3.87–5.11)
RDW: 12.4 % (ref 11.5–15.5)
WBC: 6.7 10*3/uL (ref 4.0–10.5)
nRBC: 0 % (ref 0.0–0.2)

## 2019-11-23 LAB — CBG MONITORING, ED: Glucose-Capillary: 121 mg/dL — ABNORMAL HIGH (ref 70–99)

## 2019-11-23 LAB — LIPASE, BLOOD: Lipase: 43 U/L (ref 11–51)

## 2019-11-23 MED ORDER — SODIUM CHLORIDE 0.9% FLUSH
3.0000 mL | Freq: Once | INTRAVENOUS | Status: AC
  Administered 2019-11-23: 3 mL via INTRAVENOUS

## 2019-11-23 MED ORDER — SODIUM CHLORIDE 0.9 % IV BOLUS (SEPSIS)
1000.0000 mL | Freq: Once | INTRAVENOUS | Status: AC
  Administered 2019-11-23: 1000 mL via INTRAVENOUS

## 2019-11-23 MED ORDER — SODIUM CHLORIDE 0.9 % IV SOLN: 1000.0000 mL | INTRAVENOUS | Status: DC

## 2019-11-23 MED ORDER — ONDANSETRON 8 MG PO TBDP: 8.0000 mg | ORAL_TABLET | Freq: Three times a day (TID) | ORAL | 0 refills | Status: AC | PRN

## 2019-11-23 MED ORDER — ONDANSETRON HCL 4 MG/2ML IJ SOLN
4.0000 mg | Freq: Once | INTRAMUSCULAR | Status: AC
  Administered 2019-11-23: 4 mg via INTRAVENOUS
  Filled 2019-11-23: qty 2

## 2019-11-23 NOTE — ED Triage Notes (Signed)
Patient is complaining of feeling like she was going to pass out, nauseated, vomiting, and body aches. Patient states it started on Friday.

## 2019-11-23 NOTE — ED Notes (Signed)
Pt provided labeled specimen cup for urine collection. ENMiles 

## 2019-11-23 NOTE — ED Notes (Addendum)
Patient states that her grand daughter was diagnosed with a viral URI after having an x-ray for a cough. Patient complaining of cough, nasal congestion and sneezing.[facility] was [facility] at [facility], also diagnosed with an URI and provided a short course of antibiotics, which she has not taken yet. Patient states emesis today after eating a small meal. Patient states she has been using mucinex and other OTC cold medications at home.

## 2019-11-23 NOTE — ED Provider Notes (Signed)
[facility]-EMERGENCY DEPT Provider Note   CSN: [id number] Arrival date & time: 11/23/19  1841     History Chief Complaint  Patient presents with  . Weakness  . Emesis    Claire Torres is a 64 y.o. female.  HPI   Pt started feeling sick on Friday.  She began having trouble with sneezing and cough.  She was exposed to her grandaughter that had a recent viral infection.  [facility] and was diagnosed with a viral infection.  She was given an abx prescription and was told to start it if she was not feeling better in a couple of days.  Pt started having episodes of vomiting today.  Pt decided to come to the ED.  Past Medical History:  Diagnosis Date  . GERD (gastroesophageal reflux disease)    hx no meds  . Hypertension   . Neck pain     Patient Active Problem List   Diagnosis Date Noted  . Carpal tunnel syndrome 08/22/2015  . Neck pain 08/22/2015  . Right arm pain 08/22/2015    Past Surgical History:  Procedure Laterality Date  . COLONOSCOPY    . TRIGGER FINGER RELEASE Right 09/29/2012   Procedure: RELEASE A-1 PULLEY RIGHT MIDDLE FINGER ;  Surgeon: Nicki Reaper, MD;  Location: [facility];  Service: Orthopedics;  Laterality: Right;  . TUBAL LIGATION    . UPPER GI ENDOSCOPY       OB History    Gravida  1   Para  1   Term      Preterm      AB      Living  1     SAB      TAB      Ectopic      Multiple      Live Births              Family History  Problem Relation Age of Onset  . Hypertension Mother   . Diabetes Mother   . Cancer Mother   . Diabetes Father   . Hypertension Father   . Cancer Brother   . Diabetes Brother   . Breast cancer Maternal Aunt     Social History   Tobacco Use  . Smoking status: Never Smoker  . Smokeless tobacco: Never Used  Substance Use Topics  . Alcohol use: No  . Drug use: No    Home Medications Prior to Admission medications   Medication  Sig Start Date End Date Taking? Authorizing Provider  amLODipine (NORVASC) 2.5 MG tablet Take 1 tablet (2.5 mg total) by mouth daily. 12/05/17   Doristine Bosworth, MD  Cyanocobalamin (VITAMIN B-12 PO) Take 1,000 mg by mouth.     [provider]  gabapentin (NEURONTIN) 300 MG capsule Take 1 capsule (300 mg total) by mouth 2 (two) times daily. 10/23/17   Doristine Bosworth, MD  ibuprofen (ADVIL,MOTRIN) 800 MG tablet Take 1 tablet (800 mg total) by mouth 3 (three) times daily. 05/14/13   Ward, Layla Maw, DO  Multiple Vitamin (MULTIVITAMIN) tablet Take 1 tablet by mouth daily.    [provider]  ondansetron (ZOFRAN ODT) 8 MG disintegrating tablet Take 1 tablet (8 mg total) by mouth every 8 (eight) hours as needed for nausea or vomiting. 11/23/19   Linwood Dibbles, MD    Allergies    Patient has no known allergies.  Review of Systems   Review  of Systems  All other systems reviewed and are negative.   Physical Exam Updated Vital Signs BP 132/86   Pulse 98   Temp 98.1 F (36.7 C) (Oral)   Resp 18   Ht 1.689 m (5' 6.5")   Wt 68 kg   SpO2 95%   BMI 23.85 kg/m   Physical Exam Vitals and nursing note reviewed.  Constitutional:      General: She is not in acute distress.    Appearance: She is well-developed.  HENT:     Head: Normocephalic and atraumatic.     Right Ear: External ear normal.     Left Ear: External ear normal.  Eyes:     General: No scleral icterus.       Right eye: No discharge.        Left eye: No discharge.     Conjunctiva/sclera: Conjunctivae normal.  Neck:     Trachea: No tracheal deviation.  Cardiovascular:     Rate and Rhythm: Regular rhythm. Tachycardia present.  Pulmonary:     Effort: Pulmonary effort is normal. No respiratory distress.     Breath sounds: Normal breath sounds. No stridor. No wheezing or rales.  Abdominal:     General: Bowel sounds are normal. There is no distension.     Palpations: Abdomen is soft.     Tenderness: There is no  abdominal tenderness. There is no guarding or rebound.  Musculoskeletal:        General: No tenderness.     Cervical back: Neck supple.  Skin:    General: Skin is warm and dry.     Findings: No rash.  Neurological:     Mental Status: She is alert.     Cranial Nerves: No cranial nerve deficit (no facial droop, extraocular movements intact, no slurred speech).     Sensory: No sensory deficit.     Motor: No abnormal muscle tone or seizure activity.     Coordination: Coordination normal.     ED Results / Procedures / Treatments   Labs (all labs ordered are listed, but only abnormal results are displayed) Labs Reviewed  BASIC METABOLIC PANEL - Abnormal; Notable for the following components:      Result Value   Glucose, Bld 129 (*)    All other components within normal limits  CBG MONITORING, ED - Abnormal; Notable for the following components:   Glucose-Capillary 121 (*)    All other components within normal limits  SARS CORONAVIRUS 2 BY RT PCR (HOSPITAL ORDER, PERFORMED IN [facility])  CBC  LIPASE, BLOOD    EKG None  Radiology DG Chest 2 View  Result Date: 11/23/2019 CLINICAL DATA:  Patient is complaining of feeling like she was going to pass out, nauseated, vomiting, and body aches. Patient states it started on Friday. EXAM: CHEST - 2 VIEW COMPARISON:  Chest radiograph 05/04/2013 FINDINGS: The heart size and mediastinal contours are within normal limits. The lungs are clear. No pneumothorax or pleural effusion. The visualized skeletal structures are unremarkable. IMPRESSION: No active cardiopulmonary disease. Electronically Signed   By: Emmaline Kluver M.D.   On: 11/23/2019 21:27    Procedures Procedures (including critical care time)  Medications Ordered in ED Medications  sodium chloride 0.9 % bolus 1,000 mL (0 mLs Intravenous Stopped 11/23/19 2209)    Followed by  0.9 %  sodium chloride infusion (0 mLs Intravenous Hold 11/23/19 2102)  sodium chloride  flush (NS) 0.9 % injection 3 mL (3 mLs Intravenous  Given 11/23/19 2059)  ondansetron (ZOFRAN) injection 4 mg (4 mg Intravenous Given 11/23/19 2058)    ED Course  I have reviewed the triage vital signs and the nursing notes.  Pertinent labs & imaging results that were available during my care of the patient were reviewed by me and considered in my medical decision making (see chart for details).  Clinical Course as of Nov 22 2228  Mon Nov 23, 2019  2230 Labs reviewed.  Covid test is negative.  Chest x-ray without pneumonia.  CBC, lipase and metabolic panel unremarkable.   [JK]    Clinical Course User Index [JK] Dorie Rank, MD   MDM Rules/Calculators/A&P                      Patient presented to the ED for evaluation of recent URI symptoms as well as nausea and vomiting.  Patient was feeling weak today.  She has not had any fevers.  ED work-up is reassuring.  Laboratory tests are unremarkable.  X-ray does not show pneumonia.  Patient was treated with IV fluids and antiemetics.  She has had no further episodes of vomiting and feels better.  Suspect symptoms may be related to viral illness. Final Clinical Impression(s) / ED Diagnoses Final diagnoses:  Non-intractable vomiting with nausea, unspecified vomiting type    Rx / DC Orders ED Discharge Orders         Ordered    ondansetron (ZOFRAN ODT) 8 MG disintegrating tablet  Every 8 hours PRN     11/23/19 2228           Dorie Rank, MD 11/23/19 2230

## 2019-11-23 NOTE — Discharge Instructions (Addendum)
Take the medications as needed for nausea.  Continue to drink fluids.  Follow-up with your primary care doctor to be rechecked next week the symptoms have not resolved.  Return as needed for worsening symptoms.

## 2020-01-11 ENCOUNTER — Other Ambulatory Visit: Payer: Self-pay | Admitting: Registered Nurse

## 2020-01-11 DIAGNOSIS — Z1231 Encounter for screening mammogram for malignant neoplasm of breast: Secondary | ICD-10-CM

## 2020-02-03 ENCOUNTER — Other Ambulatory Visit: Payer: Self-pay

## 2020-02-03 ENCOUNTER — Ambulatory Visit (INDEPENDENT_AMBULATORY_CARE_PROVIDER_SITE_OTHER): Payer: 59 | Admitting: Registered Nurse

## 2020-02-03 ENCOUNTER — Encounter: Payer: Self-pay | Admitting: Registered Nurse

## 2020-02-03 VITALS — BP 167/95 | HR 77 | Temp 98.1°F | Resp 18 | Ht 66.5 in | Wt 152.4 lb

## 2020-02-03 DIAGNOSIS — Z130 Encounter for screening for diseases of the blood and blood-forming organs and certain disorders involving the immune mechanism: Secondary | ICD-10-CM

## 2020-02-03 DIAGNOSIS — Z7689 Persons encountering health services in other specified circumstances: Secondary | ICD-10-CM

## 2020-02-03 DIAGNOSIS — Z13228 Encounter for screening for other metabolic disorders: Secondary | ICD-10-CM

## 2020-02-03 DIAGNOSIS — Z13220 Encounter for screening for lipoid disorders: Secondary | ICD-10-CM

## 2020-02-03 DIAGNOSIS — Z1329 Encounter for screening for other suspected endocrine disorder: Secondary | ICD-10-CM | POA: Diagnosis not present

## 2020-02-03 LAB — POCT GLYCOSYLATED HEMOGLOBIN (HGB A1C): Hemoglobin A1C: 5.9 % — AB (ref 4.0–5.6)

## 2020-02-03 NOTE — Patient Instructions (Signed)
° ° ° °  If you have lab work done today you will be contacted with your lab results within the next 2 weeks.  If you have not heard from us then please contact us. The fastest way to get your results is to register for My Chart. ° ° °IF you received an x-ray today, you will receive an invoice from [facility]. Please contact [facility] at [phone] with questions or concerns regarding your invoice.  ° °IF you received labwork today, you will receive an invoice from [facility]. Please contact [facility] at [phone] with questions or concerns regarding your invoice.  ° °Our billing staff will not be able to assist you with questions regarding bills from these companies. ° °You will be contacted with the lab results as soon as they are available. The fastest way to get your results is to activate your My Chart account. Instructions are located on the last page of this paperwork. If you have not heard from us regarding the results in 2 weeks, please contact this office. °  ° ° ° °

## 2020-02-04 ENCOUNTER — Encounter: Payer: Self-pay | Admitting: Registered Nurse

## 2020-02-04 ENCOUNTER — Ambulatory Visit
Admission: RE | Admit: 2020-02-04 | Discharge: 2020-02-04 | Disposition: A | Discharge status: Home / Self Care | Payer: 59 | Source: Ambulatory Visit | Attending: Registered Nurse | Admitting: Registered Nurse

## 2020-02-04 DIAGNOSIS — Z1231 Encounter for screening mammogram for malignant neoplasm of breast: Secondary | ICD-10-CM

## 2020-02-04 LAB — CBC WITH DIFFERENTIAL
Basophils Absolute: 0 10*3/uL (ref 0.0–0.2)
Basos: 1 %
EOS (ABSOLUTE): 0.1 10*3/uL (ref 0.0–0.4)
Eos: 2 %
Hematocrit: 39.3 % (ref 34.0–46.6)
Hemoglobin: 13 g/dL (ref 11.1–15.9)
Immature Grans (Abs): 0 10*3/uL (ref 0.0–0.1)
Immature Granulocytes: 0 %
Lymphocytes Absolute: 1.4 10*3/uL (ref 0.7–3.1)
Lymphs: 42 %
MCH: 30.3 pg (ref 26.6–33.0)
MCHC: 33.1 g/dL (ref 31.5–35.7)
MCV: 92 fL (ref 79–97)
Monocytes Absolute: 0.3 10*3/uL (ref 0.1–0.9)
Monocytes: 8 %
Neutrophils Absolute: 1.6 10*3/uL (ref 1.4–7.0)
Neutrophils: 47 %
RBC: 4.29 x10E6/uL (ref 3.77–5.28)
RDW: 13.1 % (ref 11.7–15.4)
WBC: 3.5 10*3/uL (ref 3.4–10.8)

## 2020-02-04 LAB — COMPREHENSIVE METABOLIC PANEL
ALT: 15 IU/L (ref 0–32)
AST: 19 IU/L (ref 0–40)
Albumin/Globulin Ratio: 1.4 (ref 1.2–2.2)
Albumin: 4.1 g/dL (ref 3.8–4.8)
Alkaline Phosphatase: 106 IU/L (ref 48–121)
BUN/Creatinine Ratio: 8 — ABNORMAL LOW (ref 12–28)
BUN: 7 mg/dL — ABNORMAL LOW (ref 8–27)
Bilirubin Total: 0.5 mg/dL (ref 0.0–1.2)
CO2: 22 mmol/L (ref 20–29)
Calcium: 9.3 mg/dL (ref 8.7–10.3)
Chloride: 103 mmol/L (ref 96–106)
Creatinine, Ser: 0.87 mg/dL (ref 0.57–1.00)
GFR calc Af Amer: 82 mL/min/{1.73_m2} (ref >59)
GFR calc non Af Amer: 71 mL/min/{1.73_m2} (ref >59)
Globulin, Total: 3 g/dL (ref 1.5–4.5)
Glucose: 97 mg/dL (ref 65–99)
Potassium: 4 mmol/L (ref 3.5–5.2)
Sodium: 138 mmol/L (ref 134–144)
Total Protein: 7.1 g/dL (ref 6.0–8.5)

## 2020-02-04 LAB — LIPID PANEL
Chol/HDL Ratio: 3.2 ratio (ref 0.0–4.4)
Cholesterol, Total: 177 mg/dL (ref 100–199)
HDL: 55 mg/dL (ref >39)
LDL Chol Calc (NIH): 103 mg/dL — ABNORMAL HIGH (ref 0–99)
Triglycerides: 108 mg/dL (ref 0–149)
VLDL Cholesterol Cal: 19 mg/dL (ref 5–40)

## 2020-02-04 LAB — TSH: TSH: 2.27 u[IU]/mL (ref 0.450–4.500)

## 2020-03-23 ENCOUNTER — Encounter: Payer: Self-pay | Admitting: Registered Nurse

## 2020-03-23 NOTE — Progress Notes (Signed)
Established Patient Office Visit  Subjective:  Patient ID: Claire Torres, female    DOB: 1955/10/31  Age: 64 y.o. MRN: [id number]  CC: No chief complaint on file.   HPI Claire Torres presents for transfer of care  No acute concerns or complaints Histories reviewed with patient, updated as warranted  bp elevated but has been out of amlodipine. Can restart. No cv symptoms at this time.   Past Medical History:  Diagnosis Date  . GERD (gastroesophageal reflux disease)    hx no meds  . Hypertension   . Neck pain     Past Surgical History:  Procedure Laterality Date  . COLONOSCOPY    . TRIGGER FINGER RELEASE Right 09/29/2012   Procedure: RELEASE A-1 PULLEY RIGHT MIDDLE FINGER ;  Surgeon: Nicki Reaper, MD;  Location: [facility];  Service: Orthopedics;  Laterality: Right;  . TUBAL LIGATION    . UPPER GI ENDOSCOPY      Family History  Problem Relation Age of Onset  . Hypertension Mother   . Diabetes Mother   . Cancer Mother   . Diabetes Father   . Hypertension Father   . Cancer Brother   . Diabetes Brother   . Breast cancer Maternal Aunt     Social History   Socioeconomic History  . Marital status: Single    Spouse name: Not on file  . Number of children: 1  . Years of education: 2  . Highest education level: Not on file  Occupational History  . Occupation: Surveyor, mining    Comment: [employer] - 35+ years  Tobacco Use  . Smoking status: Never Smoker  . Smokeless tobacco: Never Used  Vaping Use  . Vaping Use: Never used  Substance and Sexual Activity  . Alcohol use: No  . Drug use: No  . Sexual activity: Not Currently    Comment: 1st intercourse-19, partners- 9, single  Other Topics Concern  . Not on file  Social History Narrative   Lives at home with her daughter.   Ambidextrous   1-2 cups tea per day.   Social Determinants of Health   Financial Resource Strain:   . Difficulty of Paying Living Expenses: Not on file  Food Insecurity:     . Worried About Programme researcher, broadcasting/film/video in the Last Year: Not on file  . Ran Out of Food in the Last Year: Not on file  Transportation Needs:   . Lack of Transportation (Medical): Not on file  . Lack of Transportation (Non-Medical): Not on file  Physical Activity:   . Days of Exercise per Week: Not on file  . Minutes of Exercise per Session: Not on file  Stress:   . Feeling of Stress : Not on file  Social Connections:   . Frequency of Communication with Friends and Family: Not on file  . Frequency of Social Gatherings with Friends and Family: Not on file  . Attends Religious Services: Not on file  . Active Member of Clubs or Organizations: Not on file  . Attends Banker Meetings: Not on file  . Marital Status: Not on file  Intimate Partner Violence:   . Fear of Current or Ex-Partner: Not on file  . Emotionally Abused: Not on file  . Physically Abused: Not on file  . Sexually Abused: Not on file    Outpatient Medications Prior to Visit  Medication Sig Dispense Refill  . amLODipine (NORVASC) 2.5 MG tablet Take 1 tablet (2.5 mg  total) by mouth daily. 90 tablet 3  . Cyanocobalamin (VITAMIN B-12 PO) Take 1,000 mg by mouth.     . gabapentin (NEURONTIN) 300 MG capsule Take 1 capsule (300 mg total) by mouth 2 (two) times daily. 180 capsule 1  . ibuprofen (ADVIL,MOTRIN) 800 MG tablet Take 1 tablet (800 mg total) by mouth 3 (three) times daily. 21 tablet 0  . Multiple Vitamin (MULTIVITAMIN) tablet Take 1 tablet by mouth daily.    . ondansetron (ZOFRAN ODT) 8 MG disintegrating tablet Take 1 tablet (8 mg total) by mouth every 8 (eight) hours as needed for nausea or vomiting. 12 tablet 0   No facility-administered medications prior to visit.    No Known Allergies  ROS Review of Systems  Constitutional: Negative.   HENT: Negative.   Eyes: Negative.   Respiratory: Negative.   Cardiovascular: Negative.   Gastrointestinal: Negative.   Genitourinary: Negative.    Musculoskeletal: Negative.   Skin: Negative.   Neurological: Negative.   Psychiatric/Behavioral: Negative.       Objective:    Physical Exam Vitals and nursing note reviewed.  Constitutional:      General: She is not in acute distress.    Appearance: Normal appearance. She is normal weight. She is not ill-appearing, toxic-appearing or diaphoretic.  Cardiovascular:     Rate and Rhythm: Normal rate and regular rhythm.     Heart sounds: Normal heart sounds. No murmur heard.  No friction rub. No gallop.   Pulmonary:     Effort: Pulmonary effort is normal. No respiratory distress.     Breath sounds: Normal breath sounds. No stridor. No wheezing, rhonchi or rales.  Chest:     Chest wall: No tenderness.  Skin:    General: Skin is warm and dry.  Neurological:     General: No focal deficit present.     Mental Status: She is alert and oriented to person, place, and time. Mental status is at baseline.  Psychiatric:        Mood and Affect: Mood normal.        Behavior: Behavior normal.        Thought Content: Thought content normal.        Judgment: Judgment normal.     BP (!) 167/95   Pulse 77   Temp 98.1 F (36.7 C) (Temporal)   Resp 18   Ht 5' 6.5" (1.689 m)   Wt 152 lb 6.4 oz (69.1 kg)   SpO2 97%   BMI 24.23 kg/m  Wt Readings from Last 3 Encounters:  02/03/20 152 lb 6.4 oz (69.1 kg)  11/23/19 150 lb (68 kg)  04/02/18 164 lb (74.4 kg)     Health Maintenance Due  Topic Date Due  . INFLUENZA VACCINE  Never done    There are no preventive care reminders to display for this patient.  Lab Results  Component Value Date   TSH 2.270 02/03/2020   Lab Results  Component Value Date   WBC 3.5 02/03/2020   HGB 13.0 02/03/2020   HCT 39.3 02/03/2020   MCV 92 02/03/2020   PLT 244 11/23/2019   Lab Results  Component Value Date   NA 138 02/03/2020   K 4.0 02/03/2020   CO2 22 02/03/2020   GLUCOSE 97 02/03/2020   BUN 7 (L) 02/03/2020   CREATININE 0.87 02/03/2020    BILITOT 0.5 02/03/2020   ALKPHOS 106 02/03/2020   AST 19 02/03/2020   ALT 15 02/03/2020   PROT 7.1 02/03/2020  ALBUMIN 4.1 02/03/2020   CALCIUM 9.3 02/03/2020   ANIONGAP 12 11/23/2019   Lab Results  Component Value Date   CHOL 177 02/03/2020   Lab Results  Component Value Date   HDL 55 02/03/2020   Lab Results  Component Value Date   LDLCALC 103 (H) 02/03/2020   Lab Results  Component Value Date   TRIG 108 02/03/2020   Lab Results  Component Value Date   CHOLHDL 3.2 02/03/2020   Lab Results  Component Value Date   HGBA1C 5.9 (A) 02/03/2020      Assessment & Plan:   Problem List Items Addressed This Visit    None    Visit Diagnoses    Screening for endocrine, metabolic and immunity disorder    -  Primary   Relevant Orders   CBC With Differential (Completed)   Comprehensive metabolic panel (Completed)   TSH (Completed)   POCT glycosylated hemoglobin (Hb A1C) (Completed)   Lipid screening       Relevant Orders   Lipid panel (Completed)      No orders of the defined types were placed in this encounter.   Follow-up: No follow-ups on file.   PLAN  Collect routine labs  Restart amlodipine  Return in 2 weeks for nurse visit bp check  Return as labs indicate if bp wnl  Patient encouraged to call clinic with any questions, comments, or concerns.  Janeece Agee, NP

## 2020-07-17 IMAGING — MG DIGITAL SCREENING BILATERAL MAMMOGRAM WITH TOMO AND CAD
6 of 10 series · 6 of 30 positions shown · non-contrast
Comparison: Previous exam(s).

CLINICAL DATA: Screening.

EXAM:
DIGITAL SCREENING BILATERAL MAMMOGRAM WITH TOMO AND CAD

[R MLO synth-2D]
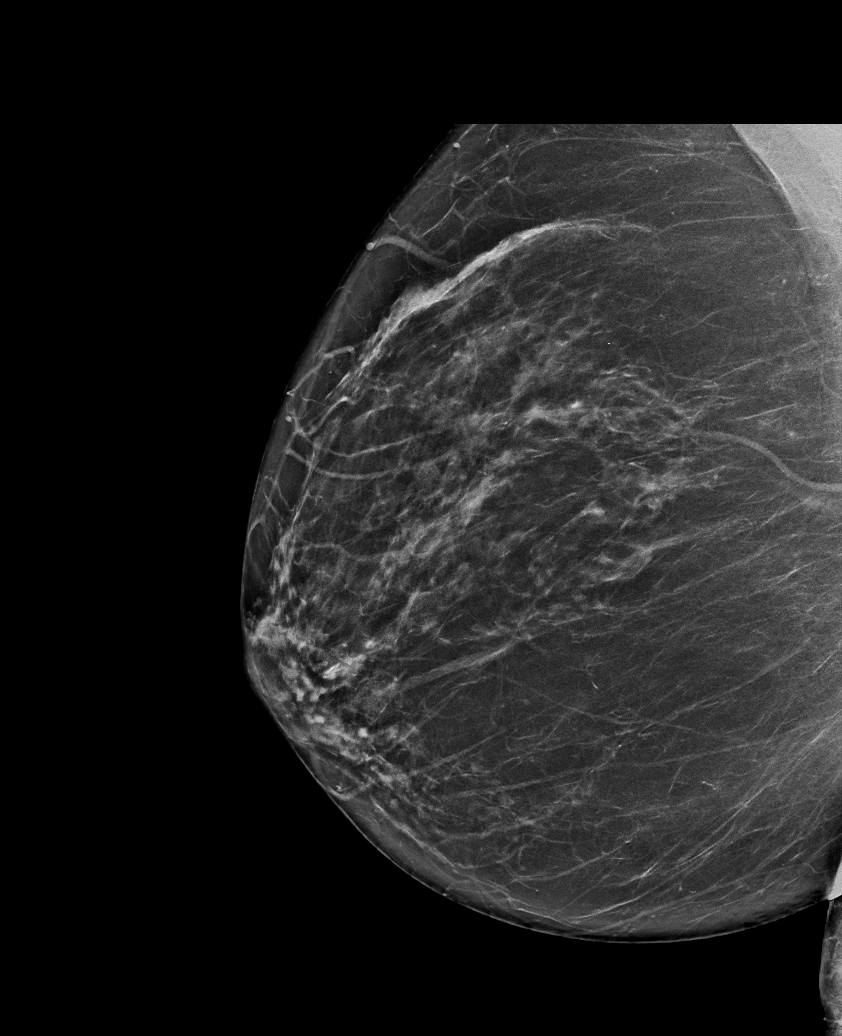

[R CC synth-2D]
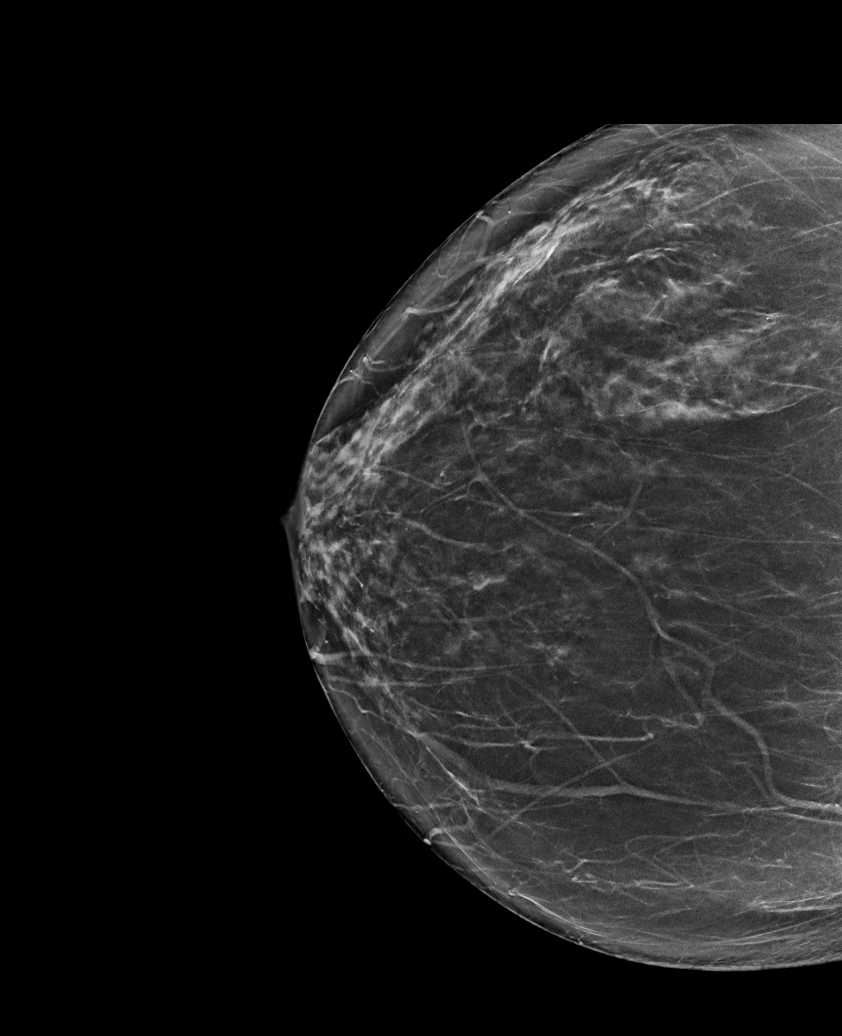

[L MLO synth-2D (1 of 2)]
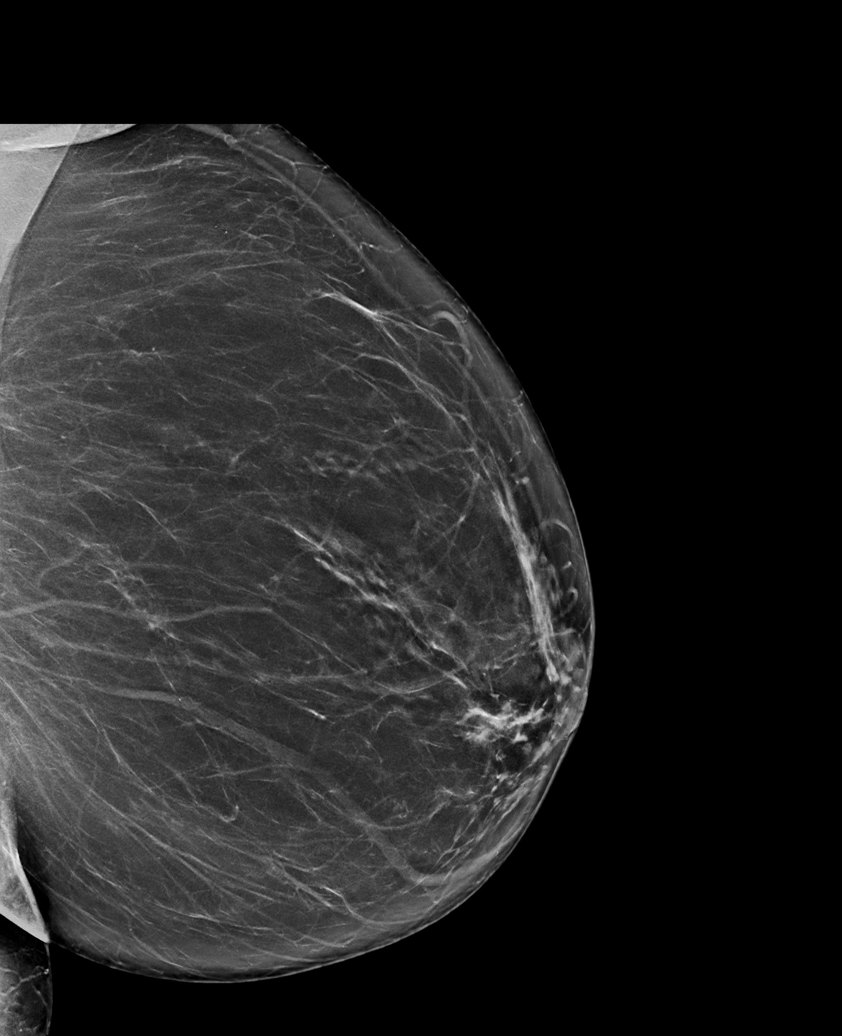

[L MLO synth-2D (2 of 2)]
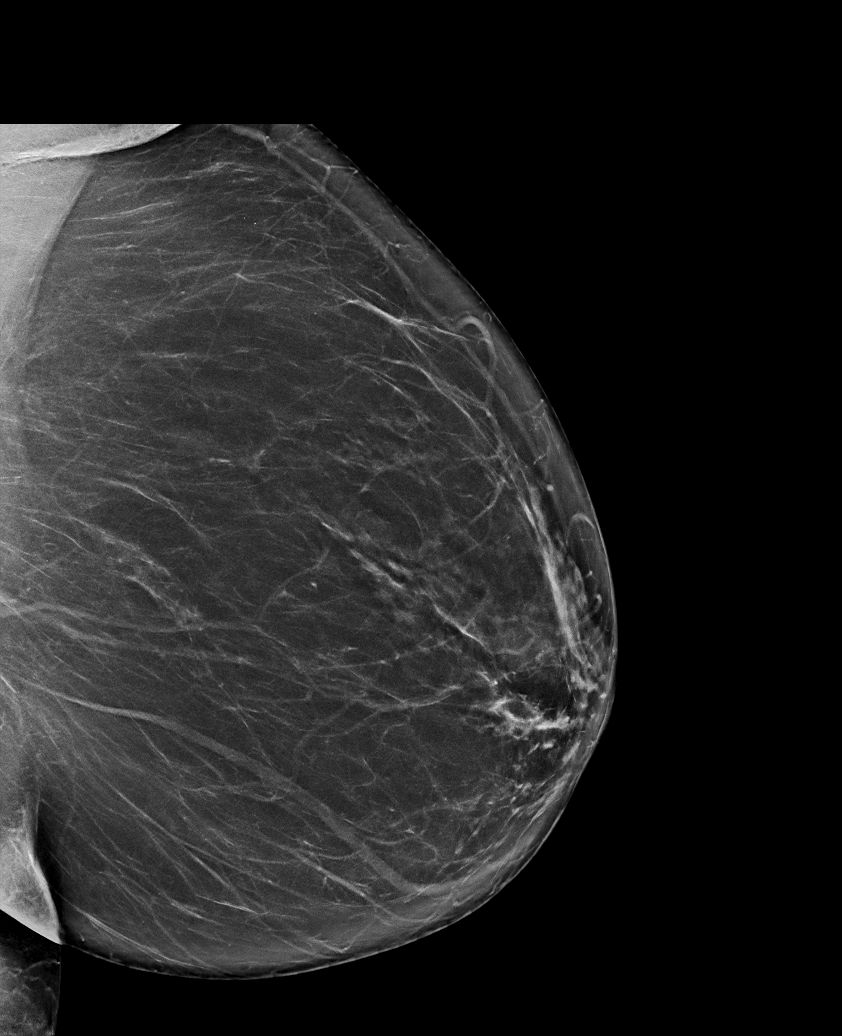

[L CC synth-2D]
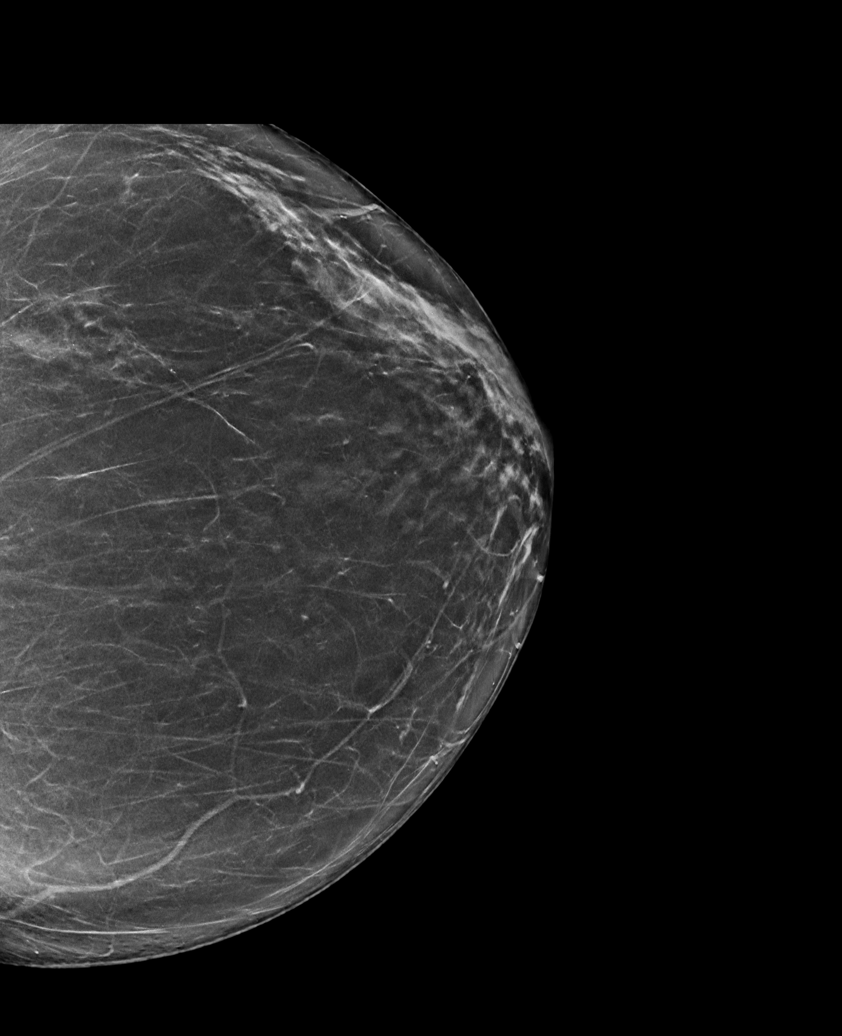

[L CC tomo · tomo slice 37/74.0]
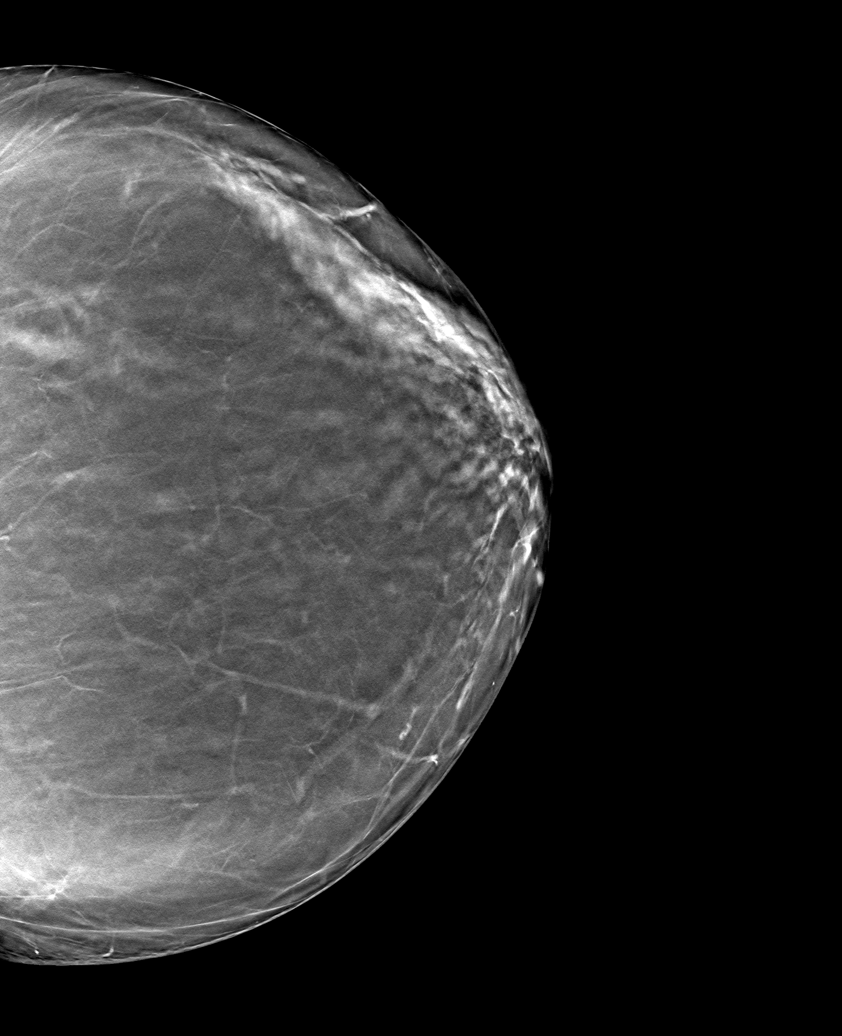

[6 of 30 positions shown; findings below may reference images not displayed]

ACR Breast Density Category b: There are scattered areas of
fibroglandular density.
FINDINGS: There are no findings suspicious for malignancy. Images were
processed with CAD.
IMPRESSION: No mammographic evidence of malignancy. A result letter of this
screening mammogram will be mailed directly to the patient.

RECOMMENDATION:
Screening mammogram in one year. (Code:[id number])

BI-RADS CATEGORY  1: Negative.

## 2020-12-21 ENCOUNTER — Other Ambulatory Visit: Payer: Self-pay | Admitting: Registered Nurse

## 2020-12-21 DIAGNOSIS — Z1231 Encounter for screening mammogram for malignant neoplasm of breast: Secondary | ICD-10-CM

## 2021-02-16 ENCOUNTER — Ambulatory Visit
Admission: RE | Admit: 2021-02-16 | Discharge: 2021-02-16 | Disposition: A | Discharge status: Home / Self Care | Payer: 59 | Source: Ambulatory Visit | Attending: Registered Nurse | Admitting: Registered Nurse

## 2021-02-16 ENCOUNTER — Other Ambulatory Visit: Payer: Self-pay

## 2021-02-16 DIAGNOSIS — Z1231 Encounter for screening mammogram for malignant neoplasm of breast: Secondary | ICD-10-CM

## 2021-06-28 LAB — COLOGUARD: COLOGUARD: NEGATIVE

## 2021-12-09 ENCOUNTER — Encounter: Payer: Self-pay | Admitting: Emergency Medicine

## 2021-12-09 ENCOUNTER — Other Ambulatory Visit: Payer: Self-pay

## 2021-12-09 DIAGNOSIS — R55 Syncope and collapse: Secondary | ICD-10-CM | POA: Insufficient documentation

## 2021-12-09 DIAGNOSIS — R1110 Vomiting, unspecified: Secondary | ICD-10-CM | POA: Insufficient documentation

## 2021-12-09 DIAGNOSIS — Z5321 Procedure and treatment not carried out due to patient leaving prior to being seen by health care provider: Secondary | ICD-10-CM | POA: Insufficient documentation

## 2021-12-09 LAB — CBC
HCT: 39.6 % (ref 36.0–46.0)
Hemoglobin: 12.9 g/dL (ref 12.0–15.0)
MCH: 30.2 pg (ref 26.0–34.0)
MCHC: 32.6 g/dL (ref 30.0–36.0)
MCV: 92.7 fL (ref 80.0–100.0)
Platelets: 280 10*3/uL (ref 150–400)
RBC: 4.27 MIL/uL (ref 3.87–5.11)
RDW: 12.8 % (ref 11.5–15.5)
WBC: 5.7 10*3/uL (ref 4.0–10.5)
nRBC: 0 % (ref 0.0–0.2)

## 2021-12-09 LAB — COMPREHENSIVE METABOLIC PANEL
ALT: 15 U/L (ref 0–44)
AST: 22 U/L (ref 15–41)
Albumin: 3.8 g/dL (ref 3.5–5.0)
Alkaline Phosphatase: 86 U/L (ref 38–126)
Anion gap: 8 (ref 5–15)
BUN: 8 mg/dL (ref 8–23)
CO2: 26 mmol/L (ref 22–32)
Calcium: 8.9 mg/dL (ref 8.9–10.3)
Chloride: 108 mmol/L (ref 98–111)
Creatinine, Ser: 0.82 mg/dL (ref 0.44–1.00)
GFR, Estimated: >60 mL/min (ref >60)
Glucose, Bld: 121 mg/dL — ABNORMAL HIGH (ref 70–99)
Potassium: 3.3 mmol/L — ABNORMAL LOW (ref 3.5–5.1)
Sodium: 142 mmol/L (ref 135–145)
Total Bilirubin: 0.8 mg/dL (ref 0.3–1.2)
Total Protein: 7.5 g/dL (ref 6.5–8.1)

## 2021-12-09 LAB — LIPASE, BLOOD: Lipase: 41 U/L (ref 11–51)

## 2021-12-09 NOTE — ED Triage Notes (Signed)
Pt to ED via POV, states went to a funeral then went to eat. Pt states went to the bathroom and had a near syncopal episode. Pt states laid herself down on the floor "for a minute". Pt denies LOC at this time, denies hitting her head.   Pt states hx of "similar episodes". Pt states she thought she might have had a reaction to mixing splenda in her lemon water.

## 2021-12-10 ENCOUNTER — Emergency Department
Admission: EM | Admit: 2021-12-10 | Discharge: 2021-12-10 | Discharge status: Against Medical Advice | Payer: Self-pay | Attending: Emergency Medicine | Admitting: Emergency Medicine

## 2021-12-10 LAB — TROPONIN I (HIGH SENSITIVITY): Troponin I (High Sensitivity): 3 ng/L (ref <18)

## 2022-01-04 ENCOUNTER — Other Ambulatory Visit: Payer: Self-pay | Admitting: Family Medicine

## 2022-01-04 DIAGNOSIS — Z1231 Encounter for screening mammogram for malignant neoplasm of breast: Secondary | ICD-10-CM

## 2022-01-30 ENCOUNTER — Other Ambulatory Visit: Payer: Self-pay | Admitting: Family Medicine

## 2022-01-30 DIAGNOSIS — E2839 Other primary ovarian failure: Secondary | ICD-10-CM

## 2022-02-19 ENCOUNTER — Ambulatory Visit
Admission: RE | Admit: 2022-02-19 | Discharge: 2022-02-19 | Disposition: A | Discharge status: Home / Self Care | Payer: Medicare HMO | Source: Ambulatory Visit | Attending: Family Medicine | Admitting: Family Medicine

## 2022-02-19 DIAGNOSIS — Z1231 Encounter for screening mammogram for malignant neoplasm of breast: Secondary | ICD-10-CM

## 2022-07-19 ENCOUNTER — Ambulatory Visit
Admission: RE | Admit: 2022-07-19 | Discharge: 2022-07-19 | Disposition: A | Discharge status: Home / Self Care | Payer: Medicare HMO | Source: Ambulatory Visit | Attending: Family Medicine | Admitting: Family Medicine

## 2022-07-19 DIAGNOSIS — E2839 Other primary ovarian failure: Secondary | ICD-10-CM

## 2022-12-24 ENCOUNTER — Other Ambulatory Visit: Payer: Self-pay | Admitting: Family Medicine

## 2022-12-24 DIAGNOSIS — Z1231 Encounter for screening mammogram for malignant neoplasm of breast: Secondary | ICD-10-CM

## 2023-02-21 ENCOUNTER — Ambulatory Visit
Admission: RE | Admit: 2023-02-21 | Discharge: 2023-02-21 | Disposition: A | Discharge status: Home / Self Care | Payer: Medicare HMO | Source: Ambulatory Visit | Attending: Family Medicine | Admitting: Family Medicine

## 2023-02-21 DIAGNOSIS — Z1231 Encounter for screening mammogram for malignant neoplasm of breast: Secondary | ICD-10-CM

## 2024-01-02 ENCOUNTER — Other Ambulatory Visit: Payer: Self-pay | Admitting: Family Medicine

## 2024-01-02 DIAGNOSIS — Z1231 Encounter for screening mammogram for malignant neoplasm of breast: Secondary | ICD-10-CM

## 2024-01-31 ENCOUNTER — Encounter (INDEPENDENT_AMBULATORY_CARE_PROVIDER_SITE_OTHER): Payer: Self-pay

## 2024-02-25 ENCOUNTER — Ambulatory Visit

## 2024-02-26 ENCOUNTER — Ambulatory Visit
Admission: RE | Admit: 2024-02-26 | Discharge: 2024-02-26 | Disposition: A | Discharge status: Home / Self Care | Source: Ambulatory Visit | Attending: Family Medicine | Admitting: Family Medicine

## 2024-02-26 DIAGNOSIS — Z1231 Encounter for screening mammogram for malignant neoplasm of breast: Secondary | ICD-10-CM

## 2024-03-26 ENCOUNTER — Ambulatory Visit (INDEPENDENT_AMBULATORY_CARE_PROVIDER_SITE_OTHER): Admitting: Otolaryngology

## 2024-03-26 ENCOUNTER — Encounter (INDEPENDENT_AMBULATORY_CARE_PROVIDER_SITE_OTHER): Payer: Self-pay | Admitting: Otolaryngology

## 2024-03-26 VITALS — BP 150/84 | HR 76 | Temp 98.0°F | Ht 65.0 in | Wt 159.0 lb

## 2024-03-26 DIAGNOSIS — R0982 Postnasal drip: Secondary | ICD-10-CM

## 2024-03-26 DIAGNOSIS — J329 Chronic sinusitis, unspecified: Secondary | ICD-10-CM | POA: Diagnosis not present

## 2024-03-26 DIAGNOSIS — J342 Deviated nasal septum: Secondary | ICD-10-CM

## 2024-03-26 DIAGNOSIS — Z9109 Other allergy status, other than to drugs and biological substances: Secondary | ICD-10-CM

## 2024-03-26 DIAGNOSIS — R0981 Nasal congestion: Secondary | ICD-10-CM

## 2024-03-26 DIAGNOSIS — J343 Hypertrophy of nasal turbinates: Secondary | ICD-10-CM

## 2024-03-26 MED ORDER — FLUTICASONE PROPIONATE 50 MCG/ACT NA SUSP: 2.0000 | Freq: Two times a day (BID) | NASAL | 6 refills | Status: AC

## 2024-03-26 MED ORDER — LEVOCETIRIZINE DIHYDROCHLORIDE 5 MG PO TABS: 5.0000 mg | ORAL_TABLET | Freq: Every evening | ORAL | 3 refills | Status: AC

## 2024-03-26 NOTE — Patient Instructions (Signed)
 Lloyd Huger Med Nasal Saline Rinse   - start nasal saline rinses with [employer] Bottle available over the counter or online to help with nasal congestion

## 2024-03-26 NOTE — Progress Notes (Signed)
 ENT CONSULT:  Reason for Consult: chronic nasal congestion    HPI: Discussed the use of AI scribe software for clinical note transcription with the patient, who gave verbal consent to proceed.  History of Present Illness Claire Torres is a 68 year old female who presents with chronic nasal congestion and sinus issues.   She has experienced chronic nasal congestion, characterized by thick yellow nasal discharge primarily affecting left side. She reports facial pressure but no pain is present.  She underwent sinus imaging at [facility] in late June but did not receive follow-up communication regarding the results. Despite multiple attempts to contact the facility, she was unsuccessful. Her primary doctor then referred her to another specialist for further evaluation.  She has a history of allergy  testing and received allergy  shots for a couple of months in the past, but this treatment was discontinued. She continues to use saline rinses and a previously prescribed nasal spray.  No facial pressure or pain. She occasionally experiences burning and watering in her eyes, particularly when blowing her nose. During past sinus infections, she experienced pain during examinations, which is not the case currently.   Records Reviewed:  ENT office visit [facility] Claire Pry PA-C [name] is a 68 y.o. female who presents as a new patient for recurrent sinusitis. She states that for years she has been diagnosed with recurrent sinus infections. The only time she did well was during pregnancy. She states that typically with seasonal change, her left nasal passage will become stopped up and there is pressure into the left cheek and peri-orbital area. Nasal drainage will be clear and transition to yellow. No associated throat symptoms. She is currently feeling well. Her last sinus infection was 2-3 months ago. She occasionally will be prescribed Amoxicillin but also treats symptoms with OTC  Mucinex  and Sudafed. Symptoms may last 2-3 weeks at a time. Current medications include Flonase  and Xyzal . States she is also using saline spray in her nose. Formal allergy  testing in the past; she did undergo immunotherapy but was told she did not need this anymore. Has lived in [place] all her life. No recent sinus CT imaging. No sinonasal surgery.    Past Medical History:  Diagnosis Date   GERD (gastroesophageal reflux disease)    hx no meds   Hypertension    Neck pain     Past Surgical History:  Procedure Laterality Date   COLONOSCOPY     TRIGGER FINGER RELEASE Right 09/29/2012   Procedure: RELEASE A-1 PULLEY RIGHT MIDDLE FINGER ;  Surgeon: Arley JONELLE Curia, MD;  Location: [facility];  Service: Orthopedics;  Laterality: Right;   TUBAL LIGATION     UPPER GI ENDOSCOPY      Family History  Problem Relation Age of Onset   Hypertension Mother    Diabetes Mother    Cancer Mother    Diabetes Father    Hypertension Father    Cancer Brother    Diabetes Brother    Breast cancer Maternal Aunt     Social History:  reports that she has never smoked. She has never used smokeless tobacco. She reports that she does not drink alcohol and does not use drugs.  Allergies: No Known Allergies  Medications: I have reviewed the patient's current medications.  The PMH, PSH, Medications, Allergies, and SH were reviewed and updated.  ROS: Constitutional: Negative for fever, weight loss and weight gain. Cardiovascular: Negative for chest pain and dyspnea on exertion. Respiratory: Is not  experiencing shortness of breath at rest. Gastrointestinal: Negative for nausea and vomiting. Neurological: Negative for headaches. Psychiatric: The patient is not nervous/anxious  Blood pressure (!) 150/84, pulse 76, temperature 98 F (36.7 C), temperature source Oral, height 5' 5 (1.651 m), weight 159 lb (72.1 kg), SpO2 94%. Body mass index is 26.46 kg/m.  PHYSICAL EXAM:  Exam: General:  Well-developed, well-nourished Respiratory Respiratory effort: Equal inspiration and expiration without stridor Cardiovascular Peripheral Vascular: Warm extremities with equal color/perfusion Eyes: No nystagmus with equal extraocular motion bilaterally Neuro/Psych/Balance: Patient oriented to person, place, and time; Appropriate mood and affect; Gait is intact with no imbalance; Cranial nerves I-XII are intact Head and Face Inspection: Normocephalic and atraumatic without mass or lesion Palpation: Facial skeleton intact without bony stepoffs Salivary Glands: No mass or tenderness Facial Strength: Facial motility symmetric and full bilaterally ENT Pinna: External ear intact and fully developed External canal: Canal is patent with intact skin Tympanic Membrane: Clear and mobile External Nose: No scar or anatomic deformity Internal Nose: Septum is deviated and S-shaped. No polyp, or purulence. Mucosal edema and erythema present.  Bilateral inferior turbinate hypertrophy.  Lips, Teeth, and gums: Mucosa and teeth intact and viable TMJ: No pain to palpation with full mobility Oral cavity/oropharynx: No erythema or exudate, no lesions present Nasopharynx: No mass or lesion with intact mucosa Neck Neck and Trachea: Midline trachea without mass or lesion Thyroid: No mass or nodularity Lymphatics: No lymphadenopathy  Procedure:   PROCEDURE NOTE: nasal endoscopy  Preoperative diagnosis: chronic sinusitis symptoms  Postoperative diagnosis: same  Procedure: Diagnostic nasal endoscopy (68768)  Surgeon: Elena Larry, M.D.  Anesthesia: Topical lidocaine  and Afrin  H&P REVIEW: The patient's history and physical were reviewed today prior to procedure. All medications were reviewed and updated as well. Complications: None Condition is stable throughout exam Indications and consent: The patient presents with symptoms of chronic sinusitis not responding to previous therapies. All the  risks, benefits, and potential complications were reviewed with the patient preoperatively and informed consent was obtained. The time out was completed with confirmation of the correct procedure.   Procedure: The patient was seated upright in the clinic. Topical lidocaine  and Afrin were applied to the nasal cavity. After adequate anesthesia had occurred, the rigid nasal endoscope was passed into the nasal cavity. The nasal mucosa, turbinates, septum, and sinus drainage pathways were visualized bilaterally. This revealed some purulence on the left but no significant secretions that might be cultured. There were no polyps or sites of significant inflammation. The mucosa was intact and there was no crusting present. The scope was then slowly withdrawn and the patient tolerated the procedure well. There were no complications or blood loss.   Studies Reviewed: CXR 11/23/19 FINDINGS: The heart size and mediastinal contours are within normal limits. The lungs are clear. No pneumothorax or pleural effusion. The visualized skeletal structures are unremarkable.   IMPRESSION: No active cardiopulmonary disease.    Assessment/Plan: Encounter Diagnoses  Name Primary?   Environmental allergies Yes   Chronic nasal congestion    Hypertrophy of both inferior nasal turbinates    Nasal septal deviation    Post-nasal drip     Assessment and Plan Assessment & Plan Chronic rhino sinusitis with unilateral yellow nasal discharge from the left side Differential includes sinus infection versus allergic rhinitis. Nasal endoscopy with small amount of purulent drainage on the left side near middle turbinate. Allergy  testing and treatment history indicate possible allergic component. Discussed potential new allergies and treatment focus on nasal congestion,  but I did ask her to obtain a CD from [facility] with her CT scan of the sinuses so we can review and make sure there is no chronic sinus inflammation on the scan. -  nasal saline rinses twice daily. - Prescribed Xyzal  5 mg daily and Flonase  BID - Refer to an allergist for retesting and potential immunotherapy if a candidate - Request a CD of previous sinus imaging from [facility] for review. - Schedule follow-up appointment in a couple of months.      Thank you for allowing me to participate in the care of this patient. Please do not hesitate to contact me with any questions or concerns.   Elena Larry, MD Otolaryngology [facility] Phone: [phone] Fax: [phone]    03/27/2024, 2:44 PM

## 2024-04-11 LAB — COLOGUARD: COLOGUARD: NEGATIVE

## 2024-04-14 ENCOUNTER — Telehealth (INDEPENDENT_AMBULATORY_CARE_PROVIDER_SITE_OTHER): Payer: Self-pay

## 2024-04-14 NOTE — Telephone Encounter (Signed)
 Called and lvm for patient to call back to reschedule appt that she has for 05/28/24 that she has with  Dr Soldatova

## 2024-04-28 ENCOUNTER — Ambulatory Visit: Admitting: Internal Medicine

## 2024-04-28 ENCOUNTER — Encounter: Payer: Self-pay | Admitting: Internal Medicine

## 2024-04-28 VITALS — BP 132/84 | HR 70 | Temp 98.5°F | Ht 63.39 in | Wt 158.0 lb

## 2024-04-28 DIAGNOSIS — J3089 Other allergic rhinitis: Secondary | ICD-10-CM | POA: Diagnosis not present

## 2024-04-28 DIAGNOSIS — J329 Chronic sinusitis, unspecified: Secondary | ICD-10-CM

## 2024-04-28 DIAGNOSIS — J328 Other chronic sinusitis: Secondary | ICD-10-CM | POA: Diagnosis not present

## 2024-04-28 NOTE — Progress Notes (Signed)
 NEW PATIENT  Date of Service/Encounter:  04/28/24  Consult requested by: Kip Ade, NP   Subjective:   Claire Torres (DOB: May 09, 1956) is a 68 y.o. female who presents to the clinic on 04/28/2024 with a chief complaint of Sinus Problem, Allergy, and Establish Care .    History obtained from: chart review and patient.  Rhinitis:  Ongoing for all her life.  Only improved when she was pregnant.   More recently May to October, had yellow drainage and worse on L nostril.  Reports about 2-3 courses of abx this year. Denies any other skin/GI infections.  Never on IV abx or hospitalized for abx.   Symptoms include: nasal congestion, rhinorrhea, and post nasal drainage  Occurs year-round with flare ups in Spring Potential triggers: not sure  Treatments tried:  Recurrent antibiotics  Saline  Flonase daily  Xyzal 5mg  daily AIT in the past  Previous allergy testing: yes; many years ago  History of sinus surgery: no Nonallergic triggers: none    Reviewed:  03/26/2024: seen by Dr Soldatova for CRS, thought to be allergic.  Use Flonase and Xyzal, refer for allergy testing.  Scope- some purulence on L side, no polyps.    12/29/2023: CT sinus from [facility]  1. Portions of the right frontal sinus are excluded from the field of view superiorly. Within this limitation, findings are as follows. 2. Pansinusitis as described within the body of the report. 3. Most notably, there is severe left maxillary sinus disease with near complete sinus opacification. Additionally, the left maxillary sinus is slightly smaller than the right, suggesting left maxillary sinus atelectasis. The left ostiomeatal unit is obstructed. 4. Bilateral middle turbinate concha bullosa (largely opacified). Nonspecific moderate partial opacification of the bilateral nasal passages elsewhere. Soft tissue opacity bulges from the bilateral nasal cavities into the nasopharynx, suspicious for the presence of polyps.    02/19/2023: seen by [facility] for sinus congestion, sinusitis.  Using frequent abx, on Flonase/Xyzal.  Previously did AIT.  Also uses OTC Mucinex /Sudafed.  Past Medical History: Past Medical History:  Diagnosis Date   GERD (gastroesophageal reflux disease)    hx no meds   Hypertension    Neck pain    Past Surgical History: Past Surgical History:  Procedure Laterality Date   COLONOSCOPY     TRIGGER FINGER RELEASE Right 09/29/2012   Procedure: RELEASE A-1 PULLEY RIGHT MIDDLE FINGER ;  Surgeon: Arley JONELLE Curia, MD;  Location: [facility];  Service: Orthopedics;  Laterality: Right;   TUBAL LIGATION     UPPER GI ENDOSCOPY      Family History: Family History  Problem Relation Age of Onset   Hypertension Mother    Diabetes Mother    Cancer Mother    Diabetes Father    Hypertension Father    Allergic rhinitis Brother    Cancer Brother    Diabetes Brother    Breast cancer Maternal Aunt     Social History:  Flooring in bedroom: engineer, civil (consulting) Pets: dog Tobacco use/exposure: none 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 If you have any questions, ask your nurse or doctor.          amLODipine  2.5 MG tablet Commonly known as: NORVASC  Take 1 tablet (2.5 mg total) by mouth daily.  clindamycin 300 MG capsule Commonly known as: CLEOCIN Take 300 mg by mouth every 8 (eight) hours.   fluticasone 50 MCG/ACT nasal spray Commonly known as: FLONASE Place 2 sprays into both nostrils 2 (two) times daily.   gabapentin  300 MG capsule Commonly known as: NEURONTIN  Take 1 capsule (300 mg total) by mouth 2 (two) times daily.   ibuprofen  800 MG tablet Commonly known as: ADVIL  Take 1 tablet (800 mg total) by mouth 3 (three) times daily.   levocetirizine 5 MG tablet Commonly known as: Xyzal Allergy 24HR Take 1 tablet (5 mg total) by mouth  every evening.   metoprolol succinate 25 MG 24 hr tablet Commonly known as: TOPROL-XL Take 25 mg by mouth daily.   multivitamin tablet Take 1 tablet by mouth daily.   naproxen 500 MG tablet Commonly known as: NAPROSYN Take 500 mg by mouth daily.   ondansetron  8 MG disintegrating tablet Commonly known as: Zofran  ODT Take 1 tablet (8 mg total) by mouth every 8 (eight) hours as needed for nausea or vomiting.   tiZANidine 4 MG tablet Commonly known as: ZANAFLEX Take 4 mg by mouth.   VITAMIN B-12 PO Take 1,000 mg by mouth.         REVIEW OF SYSTEMS: Pertinent positives and negatives discussed in HPI.   Objective:   Physical Exam: BP 132/84 (Cuff Size: Normal)   Pulse 70   Temp 98.5 F (36.9 C)   Ht 5' 3.39 (1.61 m)   Wt 158 lb (71.7 kg)   SpO2 96%   BMI 27.65 kg/m  Body mass index is 27.65 kg/m. GEN: alert, well developed HEENT: clear conjunctiva, nose with + mild inferior turbinate hypertrophy, pale nasal mucosa, slight clear rhinorrhea, + cobblestoning HEART: regular rate and rhythm, no murmur LUNGS: clear to auscultation bilaterally, no coughing, unlabored respiration ABDOMEN: soft, non distended  SKIN: no rashes or lesions  Assessment:   1. Other allergic rhinitis   2. Recurrent sinus infections   3. Other chronic sinusitis     Plan/Recommendations:  Other Allergic Rhinitis: Chronic Sinusitis - Due to turbinate hypertrophy, seasonal symptoms and unresponsive to over the counter meds, will perform skin testing to identify aeroallergen triggers.   - Use nasal saline rinses before nose sprays such as with Neilmed Sinus Rinse.  Use distilled water.   - Use Flonase 2 sprays each nostril daily. Aim upward and outward. - Use Xyzal (Levocetirizine) 5 mg daily.  - Continue follow up with ENT.    Recurrent Sinus Infection - Keep track of infections and use of antibiotics.    Hold all anti-histamines (Xyzal, Allegra, Zyrtec, Claritin, Benadryl, Pepcid) 3  days prior to next visit.  Follow up: 11/11 at 930 for skin testing 1-55, IDs okay   Arleta Blanch, MD [facility] 

## 2024-04-28 NOTE — Patient Instructions (Addendum)
 Other Allergic Rhinitis: Chronic Sinusitis - Use nasal saline rinses before nose sprays such as with Neilmed Sinus Rinse.  Use distilled water.   - Use Flonase 2 sprays each nostril daily. Aim upward and outward. - Use Xyzal (Levocetirizine) 5 mg daily.   Recurrent Sinus Infection - Keep track of infections and use of antibiotics.    Hold all anti-histamines (Xyzal, Allegra, Zyrtec, Claritin, Benadryl, Pepcid) 3 days prior to next visit.  Follow up: 11/11 at 930 for skin testing 1-55

## 2024-05-05 ENCOUNTER — Ambulatory Visit: Admitting: Internal Medicine

## 2024-05-05 DIAGNOSIS — J3089 Other allergic rhinitis: Secondary | ICD-10-CM | POA: Diagnosis not present

## 2024-05-05 NOTE — Progress Notes (Signed)
 FOLLOW UP Date of Service/Encounter:  05/05/24   Subjective:  Claire Torres (DOB: [birth year]) is a 68 y.o. female [facility] for follow up for skin testing.   History obtained from: chart review and patient.  Anti histamines held.   Past Medical History: Past Medical History:  Diagnosis Date   GERD (gastroesophageal reflux disease)    hx no meds   Hypertension    Neck pain     Objective:  There were no vitals taken for this visit. There is no height or weight on file to calculate BMI. Physical Exam: GEN: alert, well developed HEENT: clear conjunctiva, MMM LUNGS: unlabored respiration  Skin Testing:  Skin prick testing was placed, which includes aeroallergens/foods, histamine control, and saline control.  Verbal consent was obtained prior to placing test.  Patient tolerated procedure well.  Allergy testing results were read and interpreted by myself, documented by clinical staff. Adequate positive and negative control.  Positive results to:  Results discussed with patient/family.  Airborne Adult Perc - 05/05/24 0939     Time Antigen Placed 9060    Allergen Manufacturer Jestine    Location Back    Number of Test 55    1. Control-Buffer 50% Glycerol Negative    2. Control-Histamine 3+    3. Bahia Negative    4. Bermuda Negative    5. Johnson Negative    6. [place]  Blue Negative    7. Meadow Fescue Negative    8. Perennial Rye Negative    9. Timothy Negative    10. Ragweed Mix Negative    11. Cocklebur Negative    12. Plantain,  English Negative    13. Baccharis Negative    14. Dog Fennel Negative    15. Russian Thistle Negative    16. Lamb's Quarters Negative    17. Sheep Sorrell Negative    18. Rough Pigweed Negative    19. Marsh Elder, Rough Negative    20. Mugwort, Common Negative    21. Box, Elder Negative    22. Cedar, red Negative    23. Sweet Gum Negative    24. Pecan Pollen Negative    25. Pine Mix  Negative    26. Walnut, Black Pollen Negative    27. Red Mulberry Negative    28. Ash Mix Negative    29. Birch Mix Negative    30. Beech American Negative    31. Cottonwood, Eastern Negative    32. Hickory, White Negative    33. Maple Mix Negative    34. Oak, Eastern Mix Negative    35. Sycamore Eastern Negative    36. Alternaria Alternata Negative    37. Cladosporium Herbarum Negative    38. Aspergillus Mix Negative    39. Penicillium Mix Negative    40. Bipolaris Sorokiniana (Helminthosporium) Negative    41. Drechslera Spicifera (Curvularia) Negative    42. Mucor Plumbeus Negative    43. Fusarium Moniliforme Negative    44. Aureobasidium Pullulans (pullulara) Negative    45. Rhizopus Oryzae Negative    46. Botrytis Cinera Negative    47. Epicoccum Nigrum Negative    48. Phoma Betae Negative    49. Dust Mite Mix Negative    50. Cat Hair 10,000 BAU/ml Negative    51.  Dog Epithelia Negative    52. Mixed Feathers Negative    53. Horse Epithelia Negative    54. Cockroach, German Negative    55.  Tobacco Leaf Negative          Intradermal - 05/05/24 1036     Time Antigen Placed 1054    Allergen Manufacturer Jestine    Location Back    Number of Test 16    Control Negative    Bahia Negative    Bermuda Negative    Johnson Negative    7 Grass Negative    Ragweed Mix Negative    Weed Mix Negative    Tree Mix Negative    Mold 1 Negative    Mold 2 Negative    Mold 3 Negative    Mold 4 Negative    Mite Mix 3+    Cat Negative    Dog Negative    Cockroach Negative           Assessment:   1. Allergic rhinitis due to dust mite     Plan/Recommendations:  Allergic Rhinitis: Chronic Sinusitis - Due to turbinate hypertrophy, seasonal symptoms and unresponsive to over the counter meds, will perform skin testing to identify aeroallergen triggers.   - SPT 04/2024: positive to dust mites  - Use nasal saline rinses before nose sprays such as with Neilmed Sinus Rinse.   Use distilled water.   - Use Flonase 2 sprays each nostril daily. Aim upward and outward. - Use Azelastine 2 sprays each nostril twice daily as needed for congestion, drainage, runny nose, sneezing.  - Use Xyzal (Levocetirizine) 5 mg daily.  - Continue follow up with ENT.  [facility] noted abnormal CT sinus with pansinusitis but scope with [facility] was with just slight purulence.   Recurrent Sinus Infection - Keep track of infections and use of antibiotics.    ALLERGEN AVOIDANCE MEASURES   Dust Mites Use central air conditioning and heat; and change the filter monthly.  Pleated filters work better than mesh filters.  Electrostatic filters may also be used; wash the filter monthly.  Window air conditioners may be used, but do not clean the air as well as a central air conditioner.  Change or wash the filter monthly. Keep windows closed.  Do not use attic fans.   Encase the mattress, box springs and pillows with zippered, dust proof covers. Wash the bed linens in hot water weekly.   Remove carpet, especially from the bedroom. Remove stuffed animals, throw pillows, dust ruffles, heavy drapes and other items that collect dust from the bedroom. Do not use a humidifier.   Use wood, vinyl or leather furniture instead of cloth furniture in the bedroom. Keep the indoor humidity at 30 - 40%.      Return in about 3 months (around 08/05/2024).  Arleta Blanch, MD [facility] 

## 2024-05-05 NOTE — Patient Instructions (Addendum)
 Allergic Rhinitis: Chronic Sinusitis - SPT 04/2024: positive to dust mites  - Use nasal saline rinses before nose sprays such as with Neilmed Sinus Rinse.  Use distilled water.   - Use Flonase 2 sprays each nostril daily. Aim upward and outward. - Use Azelastine 2 sprays each nostril twice daily as needed for congestion, drainage, runny nose, sneezing.  - Use Xyzal (Levocetirizine) 5 mg daily.  - Continue follow up with ENT.     Recurrent Sinus Infection - Keep track of infections and use of antibiotics.    ALLERGEN AVOIDANCE MEASURES   Dust Mites Use central air conditioning and heat; and change the filter monthly.  Pleated filters work better than mesh filters.  Electrostatic filters may also be used; wash the filter monthly.  Window air conditioners may be used, but do not clean the air as well as a central air conditioner.  Change or wash the filter monthly. Keep windows closed.  Do not use attic fans.   Encase the mattress, box springs and pillows with zippered, dust proof covers. Wash the bed linens in hot water weekly.   Remove carpet, especially from the bedroom. Remove stuffed animals, throw pillows, dust ruffles, heavy drapes and other items that collect dust from the bedroom. Do not use a humidifier.   Use wood, vinyl or leather furniture instead of cloth furniture in the bedroom. Keep the indoor humidity at 30 - 40%.

## 2024-05-28 ENCOUNTER — Ambulatory Visit (INDEPENDENT_AMBULATORY_CARE_PROVIDER_SITE_OTHER): Admitting: Otolaryngology

## 2024-05-29 ENCOUNTER — Ambulatory Visit (INDEPENDENT_AMBULATORY_CARE_PROVIDER_SITE_OTHER)

## 2024-06-05 ENCOUNTER — Ambulatory Visit (INDEPENDENT_AMBULATORY_CARE_PROVIDER_SITE_OTHER)

## 2024-06-05 VITALS — BP 134/87 | HR 78

## 2024-06-05 NOTE — Progress Notes (Signed)
 Dear Dr. Kip, Here is my assessment for our mutual patient, Claire Torres. Thank you for allowing me the opportunity to care for your patient. Please do not hesitate to contact me should you have any other questions. Sincerely, Dr. Hadassah Parody  Otolaryngology Clinic Note Referring provider: Dr. Kip HPI:   Initial HPI (06/05/2024) Discussed the use of AI scribe software for clinical note transcription with the patient, who gave verbal consent to proceed.  This is a previous patient of Dr. Jennefer show who presents for follow-up.  History of Present Illness Claire Torres is a 68 year old female with chronic sinusitis and allergic rhinitis who presents for follow-up of persistent and recurrent sinonasal symptoms.  Sinonasal symptoms - Longstanding history of sinonasal symptoms since childhood, including persistent nasal discharge and congestion. - Over the past year, symptoms worsened during summer months (June through October), with unilateral yellow nasal discharge  - Symptoms resolved approximately one week prior to last visit and have remained absent since then. - Currently asymptomatic with no nasal congestion, facial pain, dental pain, or nasal obstruction. - No current yellow nasal discharge or acute sinonasal symptoms.  Olfactory disturbance - Persistent hyposmia attributed to chronic sinus condition.  Allergic triggers and testing - Allergy  testing revealed sensitivity to dust mites. - Symptom exacerbation during pollen season. - Scheduled to follow up with allergist in February.  Therapeutic interventions and response - Previous use of Flonase  without improvement. - Regular sinus irrigations with Neti Pot     Independent Review of Additional Tests or Records:  CT sinus 12/24/23 independently reviewed and shows total opacification of left maxillary sinus, significant bilateral inferior turbinate hypertrophy, subtotal opacification of bilateral ethmoid  sinuses       PMH/Meds/All/SocHx/FamHx/ROS:   Past Medical History:  Diagnosis Date   GERD (gastroesophageal reflux disease)    hx no meds   Hypertension    Neck pain      Past Surgical History:  Procedure Laterality Date   COLONOSCOPY     TRIGGER FINGER RELEASE Right 09/29/2012   Procedure: RELEASE A-1 PULLEY RIGHT MIDDLE FINGER ;  Surgeon: Arley JONELLE Curia, MD;  [facility];  Service: Orthopedics;  Laterality: Right;   TUBAL LIGATION     UPPER GI ENDOSCOPY      Family History  Problem Relation Age of Onset   Hypertension Mother    Diabetes Mother    Cancer Mother    Diabetes Father    Hypertension Father    Allergic rhinitis Brother    Cancer Brother    Diabetes Brother    Breast cancer Maternal Aunt      Social Connections: Not on file     Current Outpatient Medications  Medication Instructions   amLODipine  (NORVASC ) 2.5 mg, Oral, Daily   clindamycin (CLEOCIN) 300 mg, Every 8 hours   Cyanocobalamin (VITAMIN B-12 PO) 1,000 mg   fluticasone  (FLONASE ) 50 MCG/ACT nasal spray 2 sprays, Each Nare, 2 times daily   gabapentin  (NEURONTIN ) 300 mg, Oral, 2 times daily   ibuprofen  (ADVIL ) 800 mg, Oral, 3 times daily   levocetirizine (XYZAL  ALLERGY  24HR) 5 mg, Oral, Every evening   metoprolol succinate (TOPROL-XL) 25 mg, Daily   Multiple Vitamin (MULTIVITAMIN) tablet 1 tablet, Daily   naproxen (NAPROSYN) 500 mg, Daily   ondansetron  (ZOFRAN  ODT) 8 mg, Oral, Every 8 hours PRN   tiZANidine (ZANAFLEX) 4 mg     Physical Exam:   BP 134/87 (BP Location: Right Arm, Patient Position: Sitting)  Pulse 78   SpO2 98%   Salient findings:  CN II-XII intact Bilateral EAC clear and TM intact with well pneumatized middle ear spaces  Anterior rhinoscopy: Septum midline anteriorly; bilateral inferior turbinates with hypertrophy No lesions of oral cavity/oropharynx No obviously palpable neck masses/lymphadenopathy/thyromegaly No respiratory distress or  stridor  Seprately Identifiable Procedures:  Prior to initiating any procedures, risks/benefits/alternatives were explained to the patient and verbal consent obtained. None  Impression & Plans:  Claire Torres is a 68 y.o. female with   1. Other chronic sinusitis   2. Hypertrophy of both inferior nasal turbinates     Assessment and Plan Assessment & Plan Chronic sinusitis Chronic sinusitis with longstanding symptoms and persistent mucosal disease on imaging involving the left maxillary, ethmoid, and sphenoid sinuses, as well as turbinate hypertrophy.  She is currently asymptomatic. symptoms are currently well controlled with conservative management. Hyposmia is likely secondary to chronic sinus disease.  I expect her symptoms may recur given the severity of her sinus disease but she wants to avoid any surgery at this time.  Surgical intervention may be indicated if symptoms recur or worsen and conservative therapy fails.  - Recommended continued use of sinus irrigations ([place], neti pot) as tolerated. - Advised continued follow-up with allergy  specialist in February. - Instructed her to monitor for symptom recurrence and to contact the clinic if symptoms return as she would be a candidate for sinus surgery given the extensive sinus disease she has on the limited scan that she received this summer. - Discussed that if symptoms recur or worsen, a repeat CT scan may be obtained and surgical intervention considered if conservative management fails. - Scheduled follow-up in six months for reassessment.    See below regarding exact medications prescribed this encounter including dosages and route: No orders of the defined types were placed in this encounter.     Thank you for allowing me the opportunity to care for your patient. Please do not hesitate to contact me should you have any other questions.  Sincerely, Hadassah Parody, MD Otolaryngologist (ENT), [facility] Phone: [phone] Fax: [phone]

## 2024-08-05 ENCOUNTER — Ambulatory Visit: Admitting: Internal Medicine

## 2024-08-05 ENCOUNTER — Ambulatory Visit: Admitting: Allergy

## 2024-12-04 ENCOUNTER — Ambulatory Visit (INDEPENDENT_AMBULATORY_CARE_PROVIDER_SITE_OTHER)
